# Patient Record
Sex: Female | Born: 1965 | Race: White | Hispanic: No | Marital: Single | Smoking: Never smoker
Health system: Southern US, Community
[De-identification: ages and names within clinical notes are randomized; demographics above are authoritative.]

## PROBLEM LIST (undated history)

## (undated) DIAGNOSIS — N920 Excessive and frequent menstruation with regular cycle: Secondary | ICD-10-CM

## (undated) DIAGNOSIS — Z803 Family history of malignant neoplasm of breast: Secondary | ICD-10-CM

## (undated) DIAGNOSIS — Z789 Other specified health status: Secondary | ICD-10-CM

## (undated) DIAGNOSIS — R7301 Impaired fasting glucose: Secondary | ICD-10-CM

## (undated) DIAGNOSIS — Z800 Family history of malignant neoplasm of digestive organs: Secondary | ICD-10-CM

## (undated) HISTORY — PX: MOUTH SURGERY: SHX715

## (undated) HISTORY — DX: Family history of malignant neoplasm of digestive organs: Z80.0

## (undated) HISTORY — DX: Family history of malignant neoplasm of breast: Z80.3

## (undated) HISTORY — DX: Impaired fasting glucose: R73.01

## (undated) HISTORY — DX: Excessive and frequent menstruation with regular cycle: N92.0

---

## 2008-04-23 HISTORY — PX: DILATION AND CURETTAGE OF UTERUS: SHX78

## 2009-05-31 LAB — HM MAMMOGRAPHY

## 2010-06-13 ENCOUNTER — Ambulatory Visit: Payer: Self-pay

## 2013-06-10 ENCOUNTER — Ambulatory Visit: Payer: Self-pay | Admitting: Nurse Practitioner

## 2013-06-10 LAB — HM PAP SMEAR

## 2014-09-16 ENCOUNTER — Other Ambulatory Visit: Payer: Self-pay | Admitting: Nurse Practitioner

## 2014-09-16 DIAGNOSIS — R928 Other abnormal and inconclusive findings on diagnostic imaging of breast: Secondary | ICD-10-CM

## 2014-09-21 ENCOUNTER — Ambulatory Visit
Admission: RE | Admit: 2014-09-21 | Discharge: 2014-09-21 | Disposition: A | Discharge status: Home / Self Care | Payer: Managed Care, Other (non HMO) | Source: Ambulatory Visit | Attending: Nurse Practitioner | Admitting: Nurse Practitioner

## 2014-09-21 DIAGNOSIS — R928 Other abnormal and inconclusive findings on diagnostic imaging of breast: Secondary | ICD-10-CM | POA: Insufficient documentation

## 2015-03-09 ENCOUNTER — Ambulatory Visit (INDEPENDENT_AMBULATORY_CARE_PROVIDER_SITE_OTHER): Payer: Managed Care, Other (non HMO) | Admitting: Unknown Physician Specialty

## 2015-03-09 ENCOUNTER — Encounter: Payer: Self-pay | Admitting: Unknown Physician Specialty

## 2015-03-09 VITALS — BP 122/85 | HR 88 | Temp 97.4°F | Ht 71.0 in | Wt 237.6 lb

## 2015-03-09 DIAGNOSIS — R1032 Left lower quadrant pain: Secondary | ICD-10-CM | POA: Diagnosis not present

## 2015-03-09 DIAGNOSIS — I10 Essential (primary) hypertension: Secondary | ICD-10-CM

## 2015-03-09 DIAGNOSIS — N920 Excessive and frequent menstruation with regular cycle: Secondary | ICD-10-CM

## 2015-03-09 DIAGNOSIS — R7301 Impaired fasting glucose: Secondary | ICD-10-CM | POA: Insufficient documentation

## 2015-03-09 LAB — UA/M W/RFLX CULTURE, ROUTINE
BILIRUBIN UA: NEGATIVE
GLUCOSE, UA: NEGATIVE
Ketones, UA: NEGATIVE
LEUKOCYTES UA: NEGATIVE
Nitrite, UA: NEGATIVE
PROTEIN UA: NEGATIVE
SPEC GRAV UA: 1.01 (ref 1.005–1.030)
UUROB: 0.2 mg/dL (ref 0.2–1.0)
pH, UA: 5.5 (ref 5.0–7.5)

## 2015-03-09 LAB — MICROSCOPIC EXAMINATION: WBC UA: NONE SEEN /HPF (ref 0–?)

## 2015-03-09 NOTE — Progress Notes (Signed)
BP 122/85 mmHg  Pulse 88  Temp(Src) 97.4 F (36.3 C)  Ht 5\' 11"  (1.803 m)  Wt 237 lb 9.6 oz (107.775 kg)  BMI 33.15 kg/m2  SpO2 100%  LMP 03/01/2015 (Exact Date)   Subjective:    Patient ID: Brooke Henson, female    DOB: 03-04-1965, 50 y.o.   MRN: [id number]  HPI: Brooke Henson is a 50 y.o. female  Chief Complaint  Patient presents with  . Abdominal Pain    pt states she has been having pain in lower abdominal region. States she has also had urges to use the restroom since Friday.      Abdominal Pain: Pt c/o lower abd pain, onset Thursday night, started with a sharp pain radiating from LLQ, across lower abd and to rectum. Pt states that she had the urge to have a bowel movement, but unable to produce a stool. Pt states that on Friday she has been having constant pain at LLQ and Left flank with a bloating sensation across her lower abdomen that started to resolve today. Pt reports doing the "brat diet" with solid and loose formed stools over the weekend. Pt deneis frequency, urgency, dysuria, or hematuria. Pt reports drinking plenty of fluids throughout the day. Denies fevers or chills. LMP was 03/01/15 and consistent with her "premenopausal symptoms".    Relevant past medical, surgical, family and social history reviewed and updated as indicated. Interim medical history since our last visit reviewed. Allergies and medications reviewed and updated.  Review of Systems  Constitutional: Negative for fever, activity change, appetite change and fatigue.  Gastrointestinal: Positive for abdominal pain, abdominal distention and rectal pain. Negative for nausea, vomiting, diarrhea, constipation and blood in stool.  Genitourinary: Positive for flank pain. Negative for dysuria, urgency, frequency, hematuria, vaginal discharge, difficulty urinating and pelvic pain.    Per HPI unless specifically indicated above     Objective:    BP 122/85 mmHg  Pulse 88  Temp(Src) 97.4 F (36.3 C)  Ht  5\' 11"  (1.803 m)  Wt 237 lb 9.6 oz (107.775 kg)  BMI 33.15 kg/m2  SpO2 100%  LMP 03/01/2015 (Exact Date)  Wt Readings from Last 3 Encounters:  03/09/15 237 lb 9.6 oz (107.775 kg)  08/27/13 208 lb (94.348 kg)    Physical Exam  Constitutional: She is oriented to person, place, and time. She appears well-developed and well-nourished. No distress.  HENT:  Head: Normocephalic and atraumatic.  Eyes: Conjunctivae and lids are normal. Right eye exhibits no discharge. Left eye exhibits no discharge. No scleral icterus.  Cardiovascular: Normal rate and regular rhythm.   Pulmonary/Chest: Effort normal. No respiratory distress.  Abdominal: Soft. Normal appearance and bowel sounds are normal. She exhibits no distension. There is no splenomegaly or hepatomegaly. There is no tenderness. There is no CVA tenderness.  Genitourinary: Vagina normal. Cervix exhibits no motion tenderness and no discharge. No erythema, tenderness or bleeding in the vagina. No vaginal discharge found.  Musculoskeletal: Normal range of motion.  Neurological: She is alert and oriented to person, place, and time.  Skin: Skin is intact. No rash noted. No pallor.  Psychiatric: She has a normal mood and affect. Her behavior is normal. Judgment and thought content normal.  Nursing note and vitals reviewed.    Assessment & Plan:   Problem List Items Addressed This Visit      Unprioritized   Left lower quadrant pain - Primary    Pt with lower abdominal pain, currently resolving. Urinalysis negative and pelvic  exam unremarkable. Pt instructed to f/u if symptoms worsen.       Relevant Orders   UA/M w/rflx Culture, Routine       Follow up plan: Return if symptoms worsen or fail to improve. Consider [place] at that time

## 2015-03-09 NOTE — Assessment & Plan Note (Signed)
Pt with lower abdominal pain, currently resolving. Urinalysis negative and pelvic exam unremarkable. Pt instructed to f/u if symptoms worsen.

## 2015-03-16 ENCOUNTER — Telehealth: Payer: Self-pay | Admitting: Unknown Physician Specialty

## 2015-03-16 DIAGNOSIS — R1032 Left lower quadrant pain: Secondary | ICD-10-CM

## 2015-03-16 NOTE — Telephone Encounter (Signed)
done

## 2015-03-16 NOTE — Telephone Encounter (Signed)
Routing to provider  

## 2015-03-16 NOTE — Telephone Encounter (Signed)
Pt would like to be referred to GI for stomach issues. She was in the office 03/09/15 for stomach pain and she says she feels like there may be something wrong. Pt scheduled to come in Thursday 03/18/15 but she would still like to have referral if at all possible.

## 2015-03-16 NOTE — Telephone Encounter (Signed)
Called and let patient know that referral was entered. Patient said she made an appointment on Thursday because she thought she had to have an appointment to get a referral but Brooke Henson said no since we just saw her. So I cancelled her appointment for Thursday with Dr. Wynetta Emery.

## 2015-03-17 ENCOUNTER — Telehealth: Payer: Self-pay | Admitting: Gastroenterology

## 2015-03-17 NOTE — Telephone Encounter (Signed)
I have called patient to make an appointment for GI per referral for lower left quadrant pain. No answer. I have left a detailed message for patient to call us back to make an appointment.

## 2015-03-18 ENCOUNTER — Telehealth: Payer: Self-pay

## 2015-03-18 ENCOUNTER — Ambulatory Visit: Payer: Managed Care, Other (non HMO) | Admitting: Family Medicine

## 2015-03-18 DIAGNOSIS — R1032 Left lower quadrant pain: Secondary | ICD-10-CM

## 2015-03-18 NOTE — Telephone Encounter (Signed)
Thanks, but for the kind of pain she is having, I would prefer a CT scan.  I will order that.

## 2015-03-18 NOTE — Telephone Encounter (Signed)
Patient called and stated that she scheduled her appointment with GI for 04/15/15. She states that they told her she needed to have an US done before that appointment, so we need to order a [place]. She asked if we call her back, to call her at her work number.

## 2015-03-19 NOTE — Telephone Encounter (Signed)
Called and left patient a message letting her know what Malachy Mood said and that she ordered a CT scan. I asked for her to give [place] a call if she has any questions or concerns.

## 2015-03-25 ENCOUNTER — Telehealth: Payer: Self-pay | Admitting: Unknown Physician Specialty

## 2015-03-25 NOTE — Telephone Encounter (Signed)
Pt called to let us know her ct was scheduled for 04/12/15 at [place] [facility].

## 2015-03-29 NOTE — Telephone Encounter (Signed)
CT scan was approved.  Pre-certification [id number]

## 2015-04-12 ENCOUNTER — Telehealth: Payer: Self-pay | Admitting: Unknown Physician Specialty

## 2015-04-12 ENCOUNTER — Ambulatory Visit
Admission: RE | Admit: 2015-04-12 | Discharge: 2015-04-12 | Disposition: A | Discharge status: Home / Self Care | Payer: Managed Care, Other (non HMO) | Source: Ambulatory Visit | Attending: Unknown Physician Specialty | Admitting: Unknown Physician Specialty

## 2015-04-12 DIAGNOSIS — R1032 Left lower quadrant pain: Secondary | ICD-10-CM | POA: Insufficient documentation

## 2015-04-12 MED ORDER — IOHEXOL 300 MG/ML  SOLN
100.0000 mL | Freq: Once | INTRAMUSCULAR | Status: AC | PRN
  Administered 2015-04-12: 100 mL via INTRAVENOUS

## 2015-04-12 NOTE — Telephone Encounter (Signed)
Tried to call but no answer

## 2015-04-13 ENCOUNTER — Telehealth: Payer: Self-pay | Admitting: Unknown Physician Specialty

## 2015-04-13 ENCOUNTER — Other Ambulatory Visit: Payer: Self-pay | Admitting: Unknown Physician Specialty

## 2015-04-13 DIAGNOSIS — R1032 Left lower quadrant pain: Secondary | ICD-10-CM

## 2015-04-13 DIAGNOSIS — N83202 Unspecified ovarian cyst, left side: Secondary | ICD-10-CM

## 2015-04-13 NOTE — Telephone Encounter (Signed)
Pt returned call

## 2015-04-13 NOTE — Telephone Encounter (Signed)
I did not call this patient. Brooke Henson did you call this patient?

## 2015-04-13 NOTE — Telephone Encounter (Signed)
Discussed with pt results of pelvic CT.  Complex cyst or multiple cysts on left ovary.  Schedule pelvic US.  ? Constipation.  Will try a laxative.

## 2015-04-15 ENCOUNTER — Other Ambulatory Visit: Payer: Self-pay

## 2015-04-15 ENCOUNTER — Ambulatory Visit (INDEPENDENT_AMBULATORY_CARE_PROVIDER_SITE_OTHER): Payer: Managed Care, Other (non HMO) | Admitting: Gastroenterology

## 2015-04-15 ENCOUNTER — Encounter: Payer: Self-pay | Admitting: Gastroenterology

## 2015-04-15 VITALS — BP 130/74 | HR 82 | Temp 98.2°F | Ht 71.0 in | Wt 237.0 lb

## 2015-04-15 DIAGNOSIS — R1084 Generalized abdominal pain: Secondary | ICD-10-CM

## 2015-04-15 DIAGNOSIS — K5909 Other constipation: Secondary | ICD-10-CM

## 2015-04-15 NOTE — Progress Notes (Signed)
Gastroenterology Consultation  Referring Provider:     Kathrine Haddock, NP Primary Care Physician:  Kathrine Haddock, NP Primary Gastroenterologist:  Dr. Allen Norris     Reason for Consultation:     Abdominal pain        HPI:   Brooke Henson is a 50 y.o. y/o female referred for consultation & management of Abdominal pain by Dr. Kathrine Haddock, NP.  This patient reports that on February 6 she had an episode of severe abdominal pain that even a feeling that she had the bathroom but could not move her bowels. The patient states that her abdominal pain was in the right lower abdomen that went across to the left side of her abdomen. There is no report of any black stools or bloody stools. She also denies any report of nausea and vomiting. The patient put herself on a brat diet and reports that her symptoms went away. She had only 1 episode of abdominal pain since then. The patient has advanced her diet in continues to be free of pain. The patient did have a CT scan of the abdomen that showed her colon to have a large burden of stool in it. The patient states she moves her bowels every day to every other day. She was also found to have a cyst on her adnexa with a transvaginal ultrasound being set up to evaluate this. The patient has not had any unexplained weight loss. She also denies any family history of colon cancer colon polyps.  Past Medical History  Diagnosis Date  . Menorrhagia   . IFG (impaired fasting glucose)     Past Surgical History  Procedure Laterality Date  . Dilation and curettage of uterus      Prior to Admission medications   Not on File    Family History  Problem Relation Age of Onset  . Endometrial cancer Mother   . Kidney cancer Mother   . Cancer Mother     uterine  . Hypertension Mother   . Breast cancer Paternal Grandmother   . Parkinson's disease Father   . Heart disease Maternal Grandmother     CHF  . Arthritis Maternal Grandmother   . Osteoporosis Maternal  Grandmother   . Hypertension Maternal Grandfather   . Heart disease Maternal Grandfather   . Cancer Maternal Grandfather     bladder and pancreatic  . Ulcers Paternal Grandfather      Social History  Substance Use Topics  . Smoking status: Never Smoker   . Smokeless tobacco: Never Used  . Alcohol Use: 0.0 oz/week    0 Standard drinks or equivalent per week     Comment: on occasion    Allergies as of 04/15/2015 - Review Complete 04/15/2015  Allergen Reaction Noted  . Amoxicillin Diarrhea and Nausea Only 09/21/2014    Review of Systems:    All systems reviewed and negative except where noted in HPI.   Physical Exam:  BP 130/74 mmHg  Pulse 82  Temp(Src) 98.2 F (36.8 C) (Oral)  Ht 5\' 11"  (1.803 m)  Wt 237 lb (107.502 kg)  BMI 33.07 kg/m2  LMP 03/27/2015 Patient's last menstrual period was 03/27/2015. Psych:  Alert and cooperative. Normal mood and affect. General:   Alert,  Well-developed, well-nourished, pleasant and cooperative in NAD Head:  Normocephalic and atraumatic. Eyes:  Sclera clear, no icterus.   Conjunctiva pink. Ears:  Normal auditory acuity. Nose:  No deformity, discharge, or lesions. Mouth:  No deformity or lesions,oropharynx pink &  moist. Neck:  Supple; no masses or thyromegaly. Lungs:  Respirations even and unlabored.  Clear throughout to auscultation.   No wheezes, crackles, or rhonchi. No acute distress. Heart:  Regular rate and rhythm; no murmurs, clicks, rubs, or gallops. Abdomen:  Normal bowel sounds.  No bruits.  Soft, non-tender and non-distended without masses, hepatosplenomegaly or hernias noted.  No guarding or rebound tenderness.  Negative Carnett sign.   Rectal:  Deferred.  Msk:  Symmetrical without gross deformities.  Good, equal movement & strength bilaterally. Pulses:  Normal pulses noted. Extremities:  No clubbing or edema.  No cyanosis. Neurologic:  Alert and oriented x3;  grossly normal neurologically. Skin:  Intact without significant  lesions or rashes.  No jaundice. Lymph Nodes:  No significant cervical adenopathy. Psych:  Alert and cooperative. Normal mood and affect.  Imaging Studies: Ct Abdomen Pelvis W Contrast  04/12/2015  CLINICAL DATA:  Left lower quadrant pain, 6 weeks duration. EXAM: CT ABDOMEN AND PELVIS WITH CONTRAST TECHNIQUE: Multidetector CT imaging of the abdomen and pelvis was performed using the standard protocol following bolus administration of intravenous contrast. CONTRAST:  171mL OMNIPAQUE IOHEXOL 300 MG/ML  SOLN COMPARISON:  None. FINDINGS: Lung bases are clear. No pleural or pericardial fluid. The liver has a normal appearance without focal lesions or biliary ductal dilatation. No calcified gallstones. The spleen is normal. The pancreas is normal. The adrenal glands are normal. Kidneys normal except for a 1 cm cyst in the midportion on the right. The aorta and IVC are normal. No retroperitoneal mass or adenopathy. No free intraperitoneal fluid or air. There is large amount of fecal matter throughout the colon. No acute bowel pathology seen. No evidence of diverticulosis or diverticulitis. The appendix is normal. Uterus and right adnexa appear normal. Cystic abnormalities are present in the left adnexae with at least 2 major components, the larger measuring 5.4 cm in diameter and the smaller measuring 4.4 cm in diameter. These probably relate to the left ovary. The differential diagnosis is functional cyst versus endometriosis versus ovarian tumor. Pelvic ultrasound recommended for initial index step. IMPRESSION: No evidence of abdominal organ pathology. Large amount of fecal matter throughout the colon. Cystic abnormalities of the left adnexa. The differential diagnosis is functional cysts versus endometriosis versus ovarian neoplastic disease. Pelvic ultrasound recommended as initial next evaluation. Electronically Signed   By: Nelson Chimes M.D.   On: 04/12/2015 08:44    Assessment and Plan:   Brooke Henson is  a 50 y.o. y/o female who had an episode at the beginning of February of severe abdominal pain. The patient reports that she has gone on a bland diet and has been doing well without any problems except for 1 episode of slight abdominal pain. The patient reports that she had a large bowel movement yesterday. She has no worrisome symptoms such as rectal bleeding unexplained weight loss black stools or bloody stools. The patient also denies any family history of colon cancer colon polyps. Due to the CT scan showing a large stool burden and her abdominal pain she has been recommended to undergo a colonoscopy. The patient also has been told to start fiber supplementation.I have discussed risks & benefits which include, but are not limited to, bleeding, infection, perforation & drug reaction.  The patient agrees with this plan & written consent will be obtained.      Note: This dictation was prepared with Dragon dictation along with smaller phrase technology. Any transcriptional errors that result from this process are unintentional.

## 2015-04-19 ENCOUNTER — Telehealth: Payer: Self-pay | Admitting: Unknown Physician Specialty

## 2015-04-19 ENCOUNTER — Ambulatory Visit
Admission: RE | Admit: 2015-04-19 | Discharge: 2015-04-19 | Disposition: A | Discharge status: Home / Self Care | Payer: Managed Care, Other (non HMO) | Source: Ambulatory Visit | Attending: Unknown Physician Specialty | Admitting: Unknown Physician Specialty

## 2015-04-19 DIAGNOSIS — N83291 Other ovarian cyst, right side: Secondary | ICD-10-CM | POA: Diagnosis not present

## 2015-04-19 DIAGNOSIS — N83202 Unspecified ovarian cyst, left side: Secondary | ICD-10-CM | POA: Insufficient documentation

## 2015-04-19 DIAGNOSIS — N9489 Other specified conditions associated with female genital organs and menstrual cycle: Secondary | ICD-10-CM

## 2015-04-19 NOTE — Telephone Encounter (Signed)
Reviewed [place].  Complex left adnexal mass corresponding to area of pain.  Refer to gyn.  Unable to reach patient.  Left message

## 2015-05-10 ENCOUNTER — Encounter: Payer: Self-pay | Admitting: *Deleted

## 2015-05-12 NOTE — Discharge Instructions (Signed)

## 2015-05-14 ENCOUNTER — Ambulatory Visit
Admission: RE | Admit: 2015-05-14 | Discharge: 2015-05-14 | Disposition: A | Discharge status: Home / Self Care | Payer: Managed Care, Other (non HMO) | Source: Ambulatory Visit | Attending: Gastroenterology | Admitting: Gastroenterology

## 2015-05-14 ENCOUNTER — Ambulatory Visit: Payer: Managed Care, Other (non HMO) | Admitting: Anesthesiology

## 2015-05-14 ENCOUNTER — Encounter
Admission: RE | Disposition: A | Discharge status: Home / Self Care | Payer: Self-pay | Source: Ambulatory Visit | Attending: Gastroenterology

## 2015-05-14 DIAGNOSIS — I10 Essential (primary) hypertension: Secondary | ICD-10-CM | POA: Diagnosis not present

## 2015-05-14 DIAGNOSIS — K5909 Other constipation: Secondary | ICD-10-CM | POA: Insufficient documentation

## 2015-05-14 DIAGNOSIS — K5900 Constipation, unspecified: Secondary | ICD-10-CM | POA: Diagnosis not present

## 2015-05-14 DIAGNOSIS — K641 Second degree hemorrhoids: Secondary | ICD-10-CM | POA: Diagnosis not present

## 2015-05-14 DIAGNOSIS — R194 Change in bowel habit: Secondary | ICD-10-CM | POA: Diagnosis not present

## 2015-05-14 DIAGNOSIS — Z881 Allergy status to other antibiotic agents status: Secondary | ICD-10-CM | POA: Insufficient documentation

## 2015-05-14 DIAGNOSIS — Z79899 Other long term (current) drug therapy: Secondary | ICD-10-CM | POA: Diagnosis not present

## 2015-05-14 HISTORY — DX: Other specified health status: Z78.9

## 2015-05-14 HISTORY — PX: COLONOSCOPY WITH PROPOFOL: SHX5780

## 2015-05-14 SURGERY — COLONOSCOPY WITH PROPOFOL
Anesthesia: Monitor Anesthesia Care | Wound class: Contaminated

## 2015-05-14 MED ORDER — LIDOCAINE HCL (CARDIAC) 20 MG/ML IV SOLN
INTRAVENOUS | Status: DC | PRN
  Administered 2015-05-14: 20 mg via INTRAVENOUS

## 2015-05-14 MED ORDER — LACTATED RINGERS IV SOLN
INTRAVENOUS | Status: DC
  Administered 2015-05-14 (×2): via INTRAVENOUS

## 2015-05-14 MED ORDER — SODIUM CHLORIDE 0.9 % IV SOLN: INTRAVENOUS | Status: DC

## 2015-05-14 MED ORDER — PROPOFOL 10 MG/ML IV BOLUS
INTRAVENOUS | Status: DC | PRN
  Administered 2015-05-14: 100 mg via INTRAVENOUS
  Administered 2015-05-14 (×3): 20 mg via INTRAVENOUS
  Administered 2015-05-14: 40 mg via INTRAVENOUS
  Administered 2015-05-14 (×2): 20 mg via INTRAVENOUS

## 2015-05-14 SURGICAL SUPPLY — 22 items
CANISTER SUCT 1200ML W/VALVE (MISCELLANEOUS) ×3 IMPLANT
CLIP HMST 235XBRD CATH ROT (MISCELLANEOUS) IMPLANT
CLIP RESOLUTION 360 11X235 (MISCELLANEOUS)
FCP ESCP3.2XJMB 240X2.8X (MISCELLANEOUS)
FORCEPS BIOP RAD 4 LRG CAP 4 (CUTTING FORCEPS) IMPLANT
FORCEPS BIOP RJ4 240 W/NDL (MISCELLANEOUS)
FORCEPS ESCP3.2XJMB 240X2.8X (MISCELLANEOUS) IMPLANT
GOWN CVR UNV OPN BCK APRN NK (MISCELLANEOUS) ×2 IMPLANT
GOWN ISOL THUMB LOOP REG UNIV (MISCELLANEOUS) ×4
INJECTOR VARIJECT VIN23 (MISCELLANEOUS) IMPLANT
KIT DEFENDO VALVE AND CONN (KITS) IMPLANT
KIT ENDO PROCEDURE OLY (KITS) ×3 IMPLANT
MARKER SPOT ENDO TATTOO 5ML (MISCELLANEOUS) IMPLANT
PAD GROUND ADULT SPLIT (MISCELLANEOUS) IMPLANT
PROBE APC STR FIRE (PROBE) IMPLANT
SNARE SHORT THROW 13M SML OVAL (MISCELLANEOUS) IMPLANT
SNARE SHORT THROW 30M LRG OVAL (MISCELLANEOUS) IMPLANT
SNARE SNG USE RND 15MM (INSTRUMENTS) IMPLANT
SPOT EX ENDOSCOPIC TATTOO (MISCELLANEOUS)
TRAP ETRAP POLY (MISCELLANEOUS) IMPLANT
VARIJECT INJECTOR VIN23 (MISCELLANEOUS)
WATER STERILE IRR 250ML POUR (IV SOLUTION) ×3 IMPLANT

## 2015-05-14 NOTE — H&P (Signed)
  [facility]  [address] [place], [place] [address] Phone: [phone] Fax : [phone]  Primary Care Physician:  Kathrine Haddock, NP Primary Gastroenterologist:  Dr. Allen Norris  Pre-Procedure History & Physical: HPI:  Brooke Henson is a 50 y.o. female is here for an colonoscopy.   Past Medical History  Diagnosis Date  . Menorrhagia   . IFG (impaired fasting glucose)   . Medical history non-contributory     Past Surgical History  Procedure Laterality Date  . Dilation and curettage of uterus    . Mouth surgery      Prior to Admission medications   Medication Sig Start Date End Date Taking? Authorizing Provider  Ascorbic Acid (VITAMIN C PO) Take by mouth daily.   Yes Historical Provider, MD  Multiple Vitamin (MULTI VITAMIN DAILY PO) Take by mouth.   Yes Historical Provider, MD    Allergies as of 04/15/2015 - Review Complete 04/15/2015  Allergen Reaction Noted  . Amoxicillin Diarrhea and Nausea Only 09/21/2014    Family History  Problem Relation Age of Onset  . Endometrial cancer Mother   . Kidney cancer Mother   . Cancer Mother     uterine  . Hypertension Mother   . Breast cancer Paternal Grandmother   . Parkinson's disease Father   . Heart disease Maternal Grandmother     CHF  . Arthritis Maternal Grandmother   . Osteoporosis Maternal Grandmother   . Hypertension Maternal Grandfather   . Heart disease Maternal Grandfather   . Cancer Maternal Grandfather     bladder and pancreatic  . Ulcers Paternal Grandfather     Social History   Social History  . Marital Status: Single    Spouse Name: N/A  . Number of Children: N/A  . Years of Education: N/A   Occupational History  . Not on file.   Social History Main Topics  . Smoking status: Never Smoker   . Smokeless tobacco: Never Used  . Alcohol Use: 1.2 oz/week    0 Standard drinks or equivalent, 1 Glasses of wine, 1 Cans of beer per week     Comment:    . Drug Use: No  . Sexual  Activity: Not on file   Other Topics Concern  . Not on file   Social History Narrative    Review of Systems: See HPI, otherwise negative ROS  Physical Exam: BP 137/67 mmHg  Pulse 76  Temp(Src) 97.9 F (36.6 C) (Tympanic)  Resp 16  Ht 5' 11.5" (1.816 m)  Wt 234 lb (106.142 kg)  BMI 32.19 kg/m2  SpO2 98%  LMP 04/12/2015 (Exact Date) General:   Alert,  pleasant and cooperative in NAD Head:  Normocephalic and atraumatic. Neck:  Supple; no masses or thyromegaly. Lungs:  Clear throughout to auscultation.    Heart:  Regular rate and rhythm. Abdomen:  Soft, nontender and nondistended. Normal bowel sounds, without guarding, and without rebound.   Neurologic:  Alert and  oriented x4;  grossly normal neurologically.  Impression/Plan: Brooke Henson is here for an colonoscopy to be performed for Constipation and abd pain.  Risks, benefits, limitations, and alternatives regarding  colonoscopy have been reviewed with the patient.  Questions have been answered.  All parties agreeable.   Ollen Bowl, MD  05/14/2015, 8:17 AM

## 2015-05-14 NOTE — Anesthesia Procedure Notes (Signed)
Procedure Name: MAC Performed by: Ellaina Schuler Pre-anesthesia Checklist: Patient identified, Emergency Drugs available, Suction available, Patient being monitored and Timeout performed Patient Re-evaluated:Patient Re-evaluated prior to inductionOxygen Delivery Method: Nasal cannula       

## 2015-05-14 NOTE — Op Note (Signed)
[facility] Patient Name: Brooke Henson Procedure Date: 05/14/2015 8:17 AM MRN: [id number] Account #: [id number] Date of Birth: February 18, 1965 Admit Type: Outpatient Age: 50 Room: [facility] OR ROOM 01 Gender: Female Note Status: Finalized Procedure:            Colonoscopy Indications:          Change in bowel habits, Constipation Providers:            Lucilla Lame, MD Referring MD:         Kathrine Haddock, PA (Referring MD) Medicines:            Propofol per Anesthesia Complications:        No immediate complications. Procedure:            Pre-Anesthesia Assessment:                       - Prior to the procedure, a History and Physical was                        performed, and patient medications and allergies were                        reviewed. The patient's tolerance of previous                        anesthesia was also reviewed. The risks and benefits of                        the procedure and the sedation options and risks were                        discussed with the patient. All questions were                        answered, and informed consent was obtained. Prior                        Anticoagulants: The patient has taken no previous                        anticoagulant or antiplatelet agents. ASA Grade                        Assessment: II - A patient with mild systemic disease.                        After reviewing the risks and benefits, the patient was                        deemed in satisfactory condition to undergo the                        procedure.                       After obtaining informed consent, the colonoscope was                        passed under direct vision. Throughout the procedure,  the patient's blood pressure, pulse, and oxygen                        saturations were monitored continuously. The Olympus CF                        H180AL colonoscope (S#: [id number]) was introduced through                  the anus and advanced to the the cecum, identified by                        appendiceal orifice and ileocecal valve. The                        colonoscopy was performed without difficulty. The                        patient tolerated the procedure well. The quality of                        the bowel preparation was excellent. Findings:      The perianal and digital rectal examinations were normal.      Non-bleeding internal hemorrhoids were found during retroflexion. The       hemorrhoids were Grade II (internal hemorrhoids that prolapse but reduce       spontaneously). Impression:           - Non-bleeding internal hemorrhoids.                       - No specimens collected. Recommendation:       - Repeat colonoscopy in 10 years for screening unless                        any change in family history or lower GI problems. Procedure Code(s):    --- Professional ---                       [phone], Colonoscopy, flexible; diagnostic, including                        collection of specimen(s) by brushing or washing, when                        performed (separate procedure) Diagnosis Code(s):    --- Professional ---                       K59.00, Constipation, unspecified                       R19.4, Change in bowel habit CPT copyright 2016 American Medical Association. All rights reserved. The codes documented in this report are preliminary and upon coder review may  be revised to meet current compliance requirements. Lucilla Lame, MD 05/14/2015 8:40:21 AM This report has been signed electronically. Number of Addenda: 0 Note Initiated On: 05/14/2015 8:17 AM Scope Withdrawal Time: 0 hours 7 minutes 46 seconds  Total Procedure Duration: 0 hours 10 minutes 32 seconds       [facility]

## 2015-05-14 NOTE — Transfer of Care (Signed)
Immediate Anesthesia Transfer of Care Note  Patient: Brooke Henson  Procedure(s) Performed: Procedure(s): COLONOSCOPY WITH PROPOFOL (N/A)  Patient Location: PACU  Anesthesia Type: MAC  Level of Consciousness: awake, alert  and patient cooperative  Airway and Oxygen Therapy: Patient Spontanous Breathing and Patient connected to supplemental oxygen  Post-op Assessment: Post-op Vital signs reviewed, Patient's Cardiovascular Status Stable, Respiratory Function Stable, Patent Airway and No signs of Nausea or vomiting  Post-op Vital Signs: Reviewed and stable  Complications: No apparent anesthesia complications

## 2015-05-14 NOTE — Anesthesia Postprocedure Evaluation (Signed)
Anesthesia Post Note  Patient: Brooke Henson  Procedure(s) Performed: Procedure(s) (LRB): COLONOSCOPY WITH PROPOFOL (N/A)  Patient location during evaluation: PACU Anesthesia Type: MAC Level of consciousness: awake and alert and oriented Pain management: satisfactory to patient Vital Signs Assessment: post-procedure vital signs reviewed and stable Respiratory status: spontaneous breathing, nonlabored ventilation and respiratory function stable Cardiovascular status: blood pressure returned to baseline and stable Postop Assessment: Adequate PO intake and No signs of nausea or vomiting Anesthetic complications: no    Raliegh Ip

## 2015-05-14 NOTE — Anesthesia Preprocedure Evaluation (Signed)
Anesthesia Evaluation  Patient identified by MRN, date of birth, ID band  Reviewed: Allergy & Precautions, H&P , NPO status , Patient's Chart, lab work & pertinent test results  Airway Mallampati: I  TM Distance: >3 FB Neck ROM: full    Dental no notable dental hx.    Pulmonary    Pulmonary exam normal       Cardiovascular hypertension, Rhythm:regular Rate:Normal     Neuro/Psych    GI/Hepatic   Endo/Other    Renal/GU      Musculoskeletal   Abdominal   Peds  Hematology   Anesthesia Other Findings   Reproductive/Obstetrics                             Anesthesia Physical Anesthesia Plan  ASA: II  Anesthesia Plan: MAC   Post-op Pain Management:    Induction:   Airway Management Planned:   Additional Equipment:   Intra-op Plan:   Post-operative Plan:   Informed Consent: I have reviewed the patients History and Physical, chart, labs and discussed the procedure including the risks, benefits and alternatives for the proposed anesthesia with the patient or authorized representative who has indicated his/her understanding and acceptance.     Plan Discussed with: CRNA  Anesthesia Plan Comments:         Anesthesia Quick Evaluation  

## 2015-05-18 ENCOUNTER — Encounter: Payer: Self-pay | Admitting: Gastroenterology

## 2015-06-23 ENCOUNTER — Inpatient Hospital Stay: Payer: Managed Care, Other (non HMO)

## 2015-07-07 ENCOUNTER — Inpatient Hospital Stay: Payer: Managed Care, Other (non HMO)

## 2015-07-07 ENCOUNTER — Inpatient Hospital Stay: Payer: Managed Care, Other (non HMO) | Attending: Obstetrics and Gynecology | Admitting: Obstetrics and Gynecology

## 2015-07-07 VITALS — BP 146/81 | HR 86 | Temp 97.0°F | Ht 72.0 in | Wt 245.8 lb

## 2015-07-07 DIAGNOSIS — Z803 Family history of malignant neoplasm of breast: Secondary | ICD-10-CM | POA: Insufficient documentation

## 2015-07-07 DIAGNOSIS — N920 Excessive and frequent menstruation with regular cycle: Secondary | ICD-10-CM | POA: Diagnosis not present

## 2015-07-07 DIAGNOSIS — E669 Obesity, unspecified: Secondary | ICD-10-CM | POA: Insufficient documentation

## 2015-07-07 DIAGNOSIS — N83201 Unspecified ovarian cyst, right side: Secondary | ICD-10-CM | POA: Insufficient documentation

## 2015-07-07 DIAGNOSIS — K5909 Other constipation: Secondary | ICD-10-CM | POA: Insufficient documentation

## 2015-07-07 DIAGNOSIS — I10 Essential (primary) hypertension: Secondary | ICD-10-CM | POA: Insufficient documentation

## 2015-07-07 DIAGNOSIS — D3912 Neoplasm of uncertain behavior of left ovary: Secondary | ICD-10-CM | POA: Insufficient documentation

## 2015-07-07 DIAGNOSIS — N838 Other noninflammatory disorders of ovary, fallopian tube and broad ligament: Secondary | ICD-10-CM | POA: Insufficient documentation

## 2015-07-07 DIAGNOSIS — Z6833 Body mass index (BMI) 33.0-33.9, adult: Secondary | ICD-10-CM | POA: Insufficient documentation

## 2015-07-07 DIAGNOSIS — Z8049 Family history of malignant neoplasm of other genital organs: Secondary | ICD-10-CM | POA: Insufficient documentation

## 2015-07-07 DIAGNOSIS — Z8051 Family history of malignant neoplasm of kidney: Secondary | ICD-10-CM | POA: Insufficient documentation

## 2015-07-07 NOTE — Patient Instructions (Signed)
Ovarian Tumors °The ovaries are small organs that produce eggs in women. They lie on each side of the uterus. Tumors are solid growths on the ovary, not like ovarian cysts that are filled with fluid. They can be cancerous or noncancerous. All solid tumors should be looked at to make sure they are not cancerous tumors.  °RISK FACTORS °There are no known causes for ovarian tumors. However, there are several risk factors for developing cancerous tumors on the ovary, such as: °· Age. °· North American or Northern European descent. °· Personal or family history of ovarian, colon, or breast cancer. °· Presence of BRCA1 or BRCA2 genes. °· Use of fertility medicines. °· Late menopause (older than 50 years). °· Pregnancy for the first time at age 30 years or older. °SIGNS AND SYMPTOMS  °In many cases there are no symptoms. Noncancerous tumors usually have no symptoms, but cancerous tumors may have symptoms that are minor and resemble other health problems. The following are symptoms of ovarian tumors: °· Unexplained weight loss. °· Increased abdominal size. °· Belly (abdominal) pain. °· Back or pelvis pain or pressure. °· Tiredness. °· Abnormal vaginal bleeding. °· Loss of appetite. °· Frequent urination or pressure on your bladder. °· Indigestion, increased gas, and bloating. °· Painful sexual intercourse. °DIAGNOSIS  °The following test may be done to help diagnose ovarian tumors: °· An ultrasound exam. °· Imaging exams, such as an X-ray exam, CT scan, or MRI. °· Blood tests. °TREATMENT  °· The tumor will be studied in the lab under a microscope to see if it is cancer. °· Noncancerous tumors can be removed surgically with or without removing the ovary. °· Cancerous tumors usually are removed with the ovary and sometimes both ovaries are removed with the fallopian tubes, uterus, and surrounding lymph nodes to see if the cancer has spread. °· Cancerous tumors may also be treated along with surgery and radiation,  chemotherapy, or both. °HOME CARE INSTRUCTIONS  °· Follow your health care provider's advice and recommendations regarding medicine and follow-up care. °· Get a yearly physical and gynecology exam. This includes a pelvic exam if you are aged 40 years or older. °SEEK MEDICAL CARE IF:  °· You have any of the above symptoms that have not gone away after a week of treatment. °· You are losing weight for no reason. °· You feel generally ill. °  °This information is not intended to replace advice given to you by your health care provider. Make sure you discuss any questions you have with your health care provider. °  °Document Released: 10/19/2007 Document Revised: 10/30/2012 Document Reviewed: 08/08/2012 °Elsevier Interactive Patient Education ©2016 [employer] ° °

## 2015-07-07 NOTE — Progress Notes (Signed)
Gynecologic Oncology Consult Visit   Referring Provider: Stoney Bang. Georgianne Fick, MD  Chief Concern: ovarian cyst  Subjective:  Brooke Henson is a 50 y.o. female who is seen in consultation from Dr. Georgianne Fick for ovarian cystic mass.   She initially presented with left lower quadrant pain, 6 weeks duration. Evaluation as noted below. CT scan obtained as noted below revealing abnormal left adnexa. No evidence of carcinomatosis, omental disease or ascites.   04/12/2015 CT scan IMPRESSION: No evidence of abdominal organ pathology.  Large amount of fecal matter throughout the colon.  Cystic abnormalities of the left adnexa. The differential diagnosis is functional cysts versus endometriosis versus ovarian neoplastic disease. Pelvic ultrasound recommended as initial next evaluation. Cystic abnormalities are present in the left adnexae with at least 2 major components, the larger measuring 5.4 cm in diameter and the smaller measuring 4.4 cm in diameter.   Colonoscopy performed and normal.   04/19/2015 Pelvic US FINDINGS:Uterus Measurements: 8.9 x 4.4 x 3.9 cm. No fibroids or other mass visualized. Endometrium Thickness: 11 mm.  Right ovary: 3.5 x 2.3 x 3.0 cm. There is a simple appearing right ovarian cyst which measures 2.9 x 1.9 x 2.4 cm Left ovary A normal-appearing left ovary is not demonstrated. There is a complex bilobed mixed echogenicity mass in the left adnexal region. The primary lobe measures 4.9 x 4.0 x 4.6 cm with the more lateral lobe measuring 3.8 x 2.7 x 3.2 cm. This corresponds to the CT findings. There is some increased vascularity in its periphery but no classic ring of fire is demonstrated to suggest an ectopic pregnancy.   There is no free pelvic fluid.  IMPRESSION: 1. Complex appearing bilobed left adnexal mass. This could reflect an endometrioma, dermoid, or less likely a hemorrhagic cyst. A tubo-ovarian abscess is felt less likely. Further evaluation with 6-12  week follow-up pelvic ultrasound would be a useful next imaging step. If the patient's symptoms warrant further imaging now, pelvic MRI would also be useful. 2. Simple appearing right ovarian cyst measuring up to 2.9 cm in diameter. 3. Normal appearance of the uterus. The endometrium is hypervascular consistent with the timing of the patient's menstrual cycle.  06/07/2015 Pelvic US: Uterus  Measurements: 8.67 x 5.48 x 4.08 cm. No fibroids or other mass visualized.  Endometrium 6.64 mm  Right ovary Measurements: 2.99 x 3.13 x 2.38 cm. There is a simple appearing right ovarian cyst which measures 2.9 x 1.9 x 2.4 cm  Left ovary 4.3 x 4.24 x 4.06 cm complex mostly solid cystic mass  CA125 34.1  She is currently asymptomatic.     Problem List: Patient Active Problem List   Diagnosis Date Noted  . Ovarian mass, left 07/07/2015  . Chronic constipation   . Change in bowel habits   . Menorrhagia 03/09/2015  . Hypertension 03/09/2015  . IFG (impaired fasting glucose) 03/09/2015  . Left lower quadrant pain 03/09/2015    Past Medical History: Past Medical History  Diagnosis Date  . Menorrhagia   . IFG (impaired fasting glucose)   . Medical history non-contributory     Past Surgical History: Past Surgical History  Procedure Laterality Date  . Dilation and curettage of uterus  04/2008    Hysteroscopy; endometrial polyps  . Mouth surgery    . Colonoscopy with propofol N/A 05/14/2015    Procedure: COLONOSCOPY WITH PROPOFOL;  Surgeon: Lucilla Lame, MD;  Location: [facility];  Service: Endoscopy;  Laterality: N/A;    Past Gynecologic History:  Menarche:  15 Menstrual details: regular; normal flow  Menses regular: Yes Last Menstrual Period: Started on Monday 07/05/2015  OB History:  OB History  Gravida Para Term Preterm AB SAB TAB Ectopic Multiple Living  1    1         # Outcome Date GA Lbr Len/2nd Weight Sex Delivery Anes PTL Lv  1 AB                Family History: Family History  Problem Relation Age of Onset  . Endometrial cancer Mother   . Kidney cancer Mother   . Cancer Mother     uterine  . Hypertension Mother   . Breast cancer Paternal Grandmother   . Parkinson's disease Father   . Heart disease Maternal Grandmother     CHF  . Arthritis Maternal Grandmother   . Osteoporosis Maternal Grandmother   . Hypertension Maternal Grandfather   . Heart disease Maternal Grandfather   . Cancer Maternal Grandfather     bladder and pancreatic  . Ulcers Paternal Grandfather     Social History: Social History   Social History  . Marital Status: Single    Spouse Name: N/A  . Number of Children: N/A  . Years of Education: N/A   Occupational History  . Not on file.   Social History Main Topics  . Smoking status: Never Smoker   . Smokeless tobacco: Never Used  . Alcohol Use: 1.2 oz/week    0 Standard drinks or equivalent, 1 Glasses of wine, 1 Cans of beer per week     Comment:    . Drug Use: No  . Sexual Activity: Not on file   Other Topics Concern  . Not on file   Social History Narrative    Allergies: Allergies  Allergen Reactions  . Amoxicillin Diarrhea and Nausea Only    Current Medications: Current Outpatient Prescriptions  Medication Sig Dispense Refill  . Ascorbic Acid (VITAMIN C PO) Take by mouth daily.    . Multiple Vitamin (MULTI VITAMIN DAILY PO) Take by mouth.     No current facility-administered medications for this visit.    Review of Systems General: no complaints  HEENT: no complaints  Lungs: no complaints  Cardiac: no complaints  GI: no complaints  GU: no complaints  Musculoskeletal: no complaints  Extremities: no complaints  Skin: no complaints  Neuro: no complaints  Endocrine: no complaints  Psych: no complaints       Objective:  Physical Examination:  BP 146/81 mmHg  Pulse 86  Temp(Src) 97 F (36.1 C) (Tympanic)  Ht 6' (1.829 m)  Wt 245 lb 13 oz (111.5 kg)  BMI  33.33 kg/m2   ECOG Performance Status: 0 - Asymptomatic  General appearance: alert, cooperative and appears stated age HEENT:PERRLA, extra ocular movement intact and sclera clear, anicteric Lymph node survey: non-palpable, axillary, inguinal, supraclavicular Cardiovascular: regular rate and rhythm Respiratory: normal air entry, lungs clear to auscultation Abdomen: soft, non-tender, without masses or organomegaly, no hernias and no ascites Back: Normal Extremities: extremities normal, atraumatic, no cyanosis or edema Skin exam - normal coloration and turgor, no rashes, no suspicious skin lesions noted. Neurological exam reveals alert, oriented, normal speech, no focal findings or movement disorder noted.  Pelvic: exam chaperoned by nurse;  Vulva: normal appearing vulva with no masses, tenderness or lesions; Vagina: normal vagina; Adnexa:no masses; unable to palpate, limited by habitus. Uterus: not grossly enlarged, mobile, and nontender Cervix: no lesions; Rectal: deferred   Lab Review HE4  ordered  Radiologic Imaging: Reviewed CT and [place] from March 2017    Assessment:  Pressley Barsky is a 50 y.o. female diagnosed with ovarian pelvic mass. Given normal CA125, nonenlargement, and lack of symptoms I suspect she has benign disease and most likely a torsed adnexal mass. The solid component to the cyst may indicate a solid tumor such as GCT or fibroma vs hemorrhagic/infarcted cyst. Malignancy less likely.  Medical co-morbidities complicating care: obesity.  Plan:   Problem List Items Addressed This Visit      Other   Ovarian mass, left - Primary   Relevant Orders   Human Epididymis Prot 4,Serial      We discussed options for management and I have recommended surgery given the abnormal appearance and persistence. We will check HE4 to determine location of surgery and provide further information for preoperative counseling.   Family history of endometrial and renal cancer in her mother -  given types of malignancy may represent Lynch syndrome given young age of onset. If she does have malignancy we will assess for MSI as well as other genetic syndromes.     We will coordinate the OR plan with Dr. Georgianne Fick.   The patient's diagnosis, an outline of the further diagnostic and laboratory studies which will be required, the recommendation, and alternatives were discussed.  All questions were answered to the patient's satisfaction.   Gillis Ends, MD    CC:  Stoney Bang. Georgianne Fick, MD

## 2015-07-07 NOTE — Progress Notes (Signed)
Patient here for initial visit. Patient is asymptomatic.

## 2015-07-08 LAB — HUMAN EPIDIDYMIS PROT 4,SERIAL: HE4: 41.8 pmol/L (ref 0.0–63.6)

## 2015-07-09 ENCOUNTER — Telehealth: Payer: Self-pay

## 2015-07-09 NOTE — Telephone Encounter (Signed)
  Oncology Nurse Navigator Documentation  Navigator Location: [facility] (07/09/15 1400) Navigator Encounter Type: Telephone (07/09/15 1400) Telephone: Diagnostic Results (07/09/15 1400)                                        Time Spent with Patient: 15 (07/09/15 1400)   Notified Brooke Henson of normal HE4 results. She will be proceeding with surgery 07/21/15 with Dr Georgianne Fick at [facility] with Dr Theora Gianotti to assist.

## 2015-07-09 NOTE — Telephone Encounter (Signed)
  Oncology Nurse Navigator Documentation  Navigator Location: [facility] (07/09/15 1300) Navigator Encounter Type: Telephone;Diagnostic Results (07/09/15 1300)                                          Time Spent with Patient: 15 (07/09/15 1300)   Voicemail left for [facility] to return call regarding HE4 results.

## 2015-07-13 ENCOUNTER — Other Ambulatory Visit: Payer: Managed Care, Other (non HMO)

## 2015-07-14 ENCOUNTER — Other Ambulatory Visit: Payer: Managed Care, Other (non HMO)

## 2015-07-14 ENCOUNTER — Encounter: Payer: Self-pay | Admitting: *Deleted

## 2015-07-14 NOTE — Patient Instructions (Signed)
  Your procedure is scheduled on: 07-21-15 [facility]) Report to Same Day Surgery 2nd floor medical mall To find out your arrival time please call [phone] between 1PM - 3PM on 07-20-15 (TUESDAY)  Remember: Instructions that are not followed completely may result in serious medical risk, up to and including death, or upon the discretion of your surgeon and anesthesiologist your surgery may need to be rescheduled.    _x___ 1. Do not eat food or drink liquids after midnight. No gum chewing or hard candies.     __x__ 2. No Alcohol for 24 hours before or after surgery.   __x__3. No Smoking for 24 prior to surgery.   ____  4. Bring all medications with you on the day of surgery if instructed.    __x__ 5. Notify your doctor if there is any change in your medical condition     (cold, fever, infections).     Do not wear jewelry, make-up, hairpins, clips or nail polish.  Do not wear lotions, powders, or perfumes. You may wear deodorant.  Do not shave 48 hours prior to surgery. Men may shave face and neck.  Do not bring valuables to the hospital.    [facility] is not responsible for any belongings or valuables.               Contacts, dentures or bridgework may not be worn into surgery.  Leave your suitcase in the car. After surgery it may be brought to your room.  For patients admitted to the hospital, discharge time is determined by your treatment team.   Patients discharged the day of surgery will not be allowed to drive home.    Please read over the following fact sheets that you were given:   [facility] Preparing for Surgery and or MRSA Information   _x___ Take these medicines the morning of surgery with A SIP OF WATER:    1. NONE  2.  3.  4.  5.  6.  ____ Fleet Enema (as directed)   _x___ Use CHG Soap or sage wipes as directed on instruction sheet   ____ Use inhalers on the day of surgery and bring to hospital day of surgery  ____ Stop metformin 2 days prior to  surgery    ____ Take 1/2 of usual insulin dose the night before surgery and none on the morning of surgery.   ____ Stop aspirin or coumadin, or plavix  _x__ Stop Anti-inflammatories such as Advil, Aleve, Ibuprofen, Motrin, Naproxen,          Naprosyn, Goodies powders or aspirin products. Ok to take Tylenol.   _X___ Stop supplements until after surgery-STOP VITAMIN C AND PROBIOTIC NOW   ____ Bring C-Pap to the hospital.

## 2015-07-16 ENCOUNTER — Ambulatory Visit
Admission: RE | Admit: 2015-07-16 | Discharge: 2015-07-16 | Disposition: A | Discharge status: Home / Self Care | Payer: Managed Care, Other (non HMO) | Source: Ambulatory Visit | Attending: Obstetrics and Gynecology | Admitting: Obstetrics and Gynecology

## 2015-07-16 DIAGNOSIS — N83202 Unspecified ovarian cyst, left side: Secondary | ICD-10-CM | POA: Diagnosis not present

## 2015-07-16 DIAGNOSIS — Z01812 Encounter for preprocedural laboratory examination: Secondary | ICD-10-CM | POA: Insufficient documentation

## 2015-07-16 LAB — TYPE AND SCREEN
ABO/RH(D): A POS
ANTIBODY SCREEN: NEGATIVE

## 2015-07-21 ENCOUNTER — Ambulatory Visit: Payer: Managed Care, Other (non HMO) | Admitting: Certified Registered Nurse Anesthetist

## 2015-07-21 ENCOUNTER — Encounter: Payer: Self-pay | Admitting: *Deleted

## 2015-07-21 ENCOUNTER — Ambulatory Visit
Admission: RE | Admit: 2015-07-21 | Discharge: 2015-07-21 | Disposition: A | Discharge status: Home / Self Care | Payer: Managed Care, Other (non HMO) | Source: Ambulatory Visit | Attending: Obstetrics and Gynecology | Admitting: Obstetrics and Gynecology

## 2015-07-21 ENCOUNTER — Encounter
Admission: RE | Disposition: A | Discharge status: Home / Self Care | Payer: Self-pay | Source: Ambulatory Visit | Attending: Obstetrics and Gynecology

## 2015-07-21 DIAGNOSIS — N801 Endometriosis of ovary: Secondary | ICD-10-CM | POA: Diagnosis not present

## 2015-07-21 DIAGNOSIS — I10 Essential (primary) hypertension: Secondary | ICD-10-CM | POA: Diagnosis not present

## 2015-07-21 DIAGNOSIS — Z881 Allergy status to other antibiotic agents status: Secondary | ICD-10-CM | POA: Diagnosis not present

## 2015-07-21 DIAGNOSIS — N838 Other noninflammatory disorders of ovary, fallopian tube and broad ligament: Secondary | ICD-10-CM

## 2015-07-21 DIAGNOSIS — N9489 Other specified conditions associated with female genital organs and menstrual cycle: Secondary | ICD-10-CM | POA: Diagnosis present

## 2015-07-21 HISTORY — PX: CYSTOSCOPY: SHX5120

## 2015-07-21 HISTORY — PX: LAPAROSCOPIC SALPINGO OOPHERECTOMY: SHX5927

## 2015-07-21 LAB — POCT PREGNANCY, URINE: PREG TEST UR: NEGATIVE

## 2015-07-21 SURGERY — SALPINGO-OOPHORECTOMY, LAPAROSCOPIC
Anesthesia: General | Laterality: Bilateral

## 2015-07-21 MED ORDER — OXYCODONE-ACETAMINOPHEN 5-325 MG PO TABS
ORAL_TABLET | ORAL | Status: AC
  Administered 2015-07-21: 1 via ORAL
  Filled 2015-07-21: qty 1

## 2015-07-21 MED ORDER — DEXAMETHASONE SODIUM PHOSPHATE 10 MG/ML IJ SOLN
INTRAMUSCULAR | Status: DC | PRN
  Administered 2015-07-21: 10 mg via INTRAVENOUS

## 2015-07-21 MED ORDER — FAMOTIDINE 20 MG PO TABS
20.0000 mg | ORAL_TABLET | Freq: Once | ORAL | Status: AC
  Administered 2015-07-21: 20 mg via ORAL

## 2015-07-21 MED ORDER — ACETAMINOPHEN 325 MG PO TABS: 325.0000 mg | ORAL_TABLET | ORAL | Status: DC | PRN

## 2015-07-21 MED ORDER — KETOROLAC TROMETHAMINE 30 MG/ML IJ SOLN
INTRAMUSCULAR | Status: DC | PRN
  Administered 2015-07-21: 30 mg via INTRAVENOUS

## 2015-07-21 MED ORDER — OXYCODONE-ACETAMINOPHEN 5-325 MG PO TABS
1.0000 | ORAL_TABLET | Freq: Four times a day (QID) | ORAL | Status: DC | PRN
  Administered 2015-07-21: 1 via ORAL

## 2015-07-21 MED ORDER — SUGAMMADEX SODIUM 500 MG/5ML IV SOLN
INTRAVENOUS | Status: DC | PRN
  Administered 2015-07-21: 223 mg via INTRAVENOUS

## 2015-07-21 MED ORDER — ACETAMINOPHEN 10 MG/ML IV SOLN
INTRAVENOUS | Status: DC | PRN
Start: 2015-07-21 — End: 2015-07-21
  Administered 2015-07-21: 1000 mg via INTRAVENOUS

## 2015-07-21 MED ORDER — ROCURONIUM BROMIDE 100 MG/10ML IV SOLN
INTRAVENOUS | Status: DC | PRN
  Administered 2015-07-21 (×3): 10 mg via INTRAVENOUS
  Administered 2015-07-21: 40 mg via INTRAVENOUS
  Administered 2015-07-21: 10 mg via INTRAVENOUS

## 2015-07-21 MED ORDER — ONDANSETRON HCL 4 MG/2ML IJ SOLN
4.0000 mg | Freq: Once | INTRAMUSCULAR | Status: AC | PRN
  Administered 2015-07-21: 4 mg via INTRAVENOUS

## 2015-07-21 MED ORDER — FAMOTIDINE 20 MG PO TABS
ORAL_TABLET | ORAL | Status: AC
  Administered 2015-07-21: 20 mg via ORAL
  Filled 2015-07-21: qty 1

## 2015-07-21 MED ORDER — IBUPROFEN 600 MG PO TABS: 600.0000 mg | ORAL_TABLET | Freq: Four times a day (QID) | ORAL | Status: DC | PRN

## 2015-07-21 MED ORDER — LIDOCAINE HCL (CARDIAC) 20 MG/ML IV SOLN
INTRAVENOUS | Status: DC | PRN
  Administered 2015-07-21: 100 mg via INTRAVENOUS

## 2015-07-21 MED ORDER — LACTATED RINGERS IV SOLN
INTRAVENOUS | Status: DC
  Administered 2015-07-21 (×2): via INTRAVENOUS

## 2015-07-21 MED ORDER — FENTANYL CITRATE (PF) 100 MCG/2ML IJ SOLN
INTRAMUSCULAR | Status: AC
  Administered 2015-07-21: 25 ug via INTRAVENOUS
  Filled 2015-07-21: qty 2

## 2015-07-21 MED ORDER — MIDAZOLAM HCL 2 MG/2ML IJ SOLN
INTRAMUSCULAR | Status: DC | PRN
  Administered 2015-07-21: 2 mg via INTRAVENOUS

## 2015-07-21 MED ORDER — OXYCODONE-ACETAMINOPHEN 5-325 MG PO TABS: 1.0000 | ORAL_TABLET | Freq: Four times a day (QID) | ORAL | Status: DC | PRN

## 2015-07-21 MED ORDER — PROPOFOL 10 MG/ML IV BOLUS
INTRAVENOUS | Status: DC | PRN
  Administered 2015-07-21: 200 mg via INTRAVENOUS

## 2015-07-21 MED ORDER — FENTANYL CITRATE (PF) 100 MCG/2ML IJ SOLN
INTRAMUSCULAR | Status: DC | PRN
  Administered 2015-07-21 (×6): 50 ug via INTRAVENOUS

## 2015-07-21 MED ORDER — ACETAMINOPHEN 160 MG/5ML PO SOLN
325.0000 mg | ORAL | Status: DC | PRN
  Filled 2015-07-21: qty 20.3

## 2015-07-21 MED ORDER — BUPIVACAINE HCL (PF) 0.5 % IJ SOLN
INTRAMUSCULAR | Status: AC
  Filled 2015-07-21: qty 30

## 2015-07-21 MED ORDER — SUCCINYLCHOLINE CHLORIDE 20 MG/ML IJ SOLN
INTRAMUSCULAR | Status: DC | PRN
Start: 2015-07-21 — End: 2015-07-21

## 2015-07-21 MED ORDER — BUPIVACAINE HCL 0.5 % IJ SOLN
INTRAMUSCULAR | Status: DC | PRN
  Administered 2015-07-21: 18 mL

## 2015-07-21 MED ORDER — ONDANSETRON HCL 4 MG/2ML IJ SOLN
INTRAMUSCULAR | Status: DC | PRN
  Administered 2015-07-21: 4 mg via INTRAVENOUS

## 2015-07-21 MED ORDER — ONDANSETRON HCL 4 MG/2ML IJ SOLN
INTRAMUSCULAR | Status: AC
  Filled 2015-07-21: qty 2

## 2015-07-21 MED ORDER — FENTANYL CITRATE (PF) 100 MCG/2ML IJ SOLN
25.0000 ug | INTRAMUSCULAR | Status: DC | PRN
  Administered 2015-07-21 (×2): 25 ug via INTRAVENOUS

## 2015-07-21 SURGICAL SUPPLY — 46 items
ANCHOR TIS RET SYS 235ML (MISCELLANEOUS) ×4 IMPLANT
BAG URO DRAIN 2000ML W/SPOUT (MISCELLANEOUS) IMPLANT
BLADE SURG SZ11 CARB STEEL (BLADE) ×4 IMPLANT
CANISTER SUCT 1200ML W/VALVE (MISCELLANEOUS) ×4 IMPLANT
CATH FOLEY 2WAY  5CC 16FR (CATHETERS) ×2
CATH ROBINSON RED A/P 16FR (CATHETERS) ×4 IMPLANT
CATH URTH 16FR FL 2W BLN LF (CATHETERS) ×2 IMPLANT
CHLORAPREP W/TINT 26ML (MISCELLANEOUS) ×4 IMPLANT
CORD MONOPOLAR M/FML 12FT (MISCELLANEOUS) ×4 IMPLANT
DEFOGGER SCOPE WARMER CLEARIFY (MISCELLANEOUS) ×4 IMPLANT
DEVICE SUTURE ENDOST 10MM (ENDOMECHANICALS) IMPLANT
DEVICE TROCAR PUNCTURE CLOSURE (ENDOMECHANICALS) IMPLANT
DRAPE STERI POUCH LG 24X46 STR (DRAPES) ×4 IMPLANT
DRESSING SURGICEL FIBRLLR 1X2 (HEMOSTASIS) ×4 IMPLANT
DRSG SURGICEL FIBRILLAR 1X2 (HEMOSTASIS) ×8
GLOVE BIO SURGEON STRL SZ7 (GLOVE) ×16 IMPLANT
GLOVE INDICATOR 7.5 STRL GRN (GLOVE) ×16 IMPLANT
GOWN STRL REUS W/ TWL LRG LVL3 (GOWN DISPOSABLE) ×4 IMPLANT
GOWN STRL REUS W/ TWL XL LVL3 (GOWN DISPOSABLE) IMPLANT
GOWN STRL REUS W/TWL LRG LVL3 (GOWN DISPOSABLE) ×4
GOWN STRL REUS W/TWL XL LVL3 (GOWN DISPOSABLE)
GRASPER SUT TROCAR 14GX15 (MISCELLANEOUS) ×4 IMPLANT
IRRIGATION STRYKERFLOW (MISCELLANEOUS) ×2 IMPLANT
IRRIGATOR STRYKERFLOW (MISCELLANEOUS) ×4
IV LACTATED RINGERS 1000ML (IV SOLUTION) ×4 IMPLANT
KIT RM TURNOVER CYSTO AR (KITS) ×4 IMPLANT
LABEL OR SOLS (LABEL) ×4 IMPLANT
LIGASURE BLUNT 5MM 37CM (INSTRUMENTS) ×4 IMPLANT
LIQUID BAND (GAUZE/BANDAGES/DRESSINGS) ×4 IMPLANT
NDL INSUFF ACCESS 14 VERSASTEP (NEEDLE) ×4 IMPLANT
NS IRRIG 500ML POUR BTL (IV SOLUTION) ×4 IMPLANT
PACK GYN LAPAROSCOPIC (MISCELLANEOUS) ×4 IMPLANT
PAD OB MATERNITY 4.3X12.25 (PERSONAL CARE ITEMS) ×4 IMPLANT
PAD PREP 24X41 OB/GYN DISP (PERSONAL CARE ITEMS) ×4 IMPLANT
SCISSORS METZENBAUM CVD 33 (INSTRUMENTS) IMPLANT
SET CYSTO W/LG BORE CLAMP LF (SET/KITS/TRAYS/PACK) ×4 IMPLANT
SHEARS HARMONIC ACE PLUS 36CM (ENDOMECHANICALS) IMPLANT
SLEEVE ENDOPATH XCEL 5M (ENDOMECHANICALS) ×4 IMPLANT
SPONGE XRAY 4X4 16PLY STRL (MISCELLANEOUS) ×4 IMPLANT
SUT ENDO VLOC 180-0-8IN (SUTURE) IMPLANT
SUT MNCRL AB 4-0 PS2 18 (SUTURE) ×4 IMPLANT
SUT VIC AB 2-0 UR6 27 (SUTURE) ×4 IMPLANT
TROCAR ENDO BLADELESS 11MM (ENDOMECHANICALS) ×4 IMPLANT
TROCAR VERSASTEP PLUS 12MM (TROCAR) ×4 IMPLANT
TROCAR XCEL NON-BLD 5MMX100MML (ENDOMECHANICALS) ×4 IMPLANT
TUBING INSUFFLATOR HI FLOW (MISCELLANEOUS) ×4 IMPLANT

## 2015-07-21 NOTE — Transfer of Care (Signed)
Immediate Anesthesia Transfer of Care Note  Patient: Brooke Henson  Procedure(s) Performed: Procedure(s): LAPAROSCOPIC LEFT SALPINGO OOPHORECTOMY, RIGHT SALPINGECTOMY (Bilateral) CYSTOSCOPY  Patient Location: PACU  Anesthesia Type:General  Level of Consciousness: sedated  Airway & Oxygen Therapy: Patient Spontanous Breathing and Patient connected to face mask oxygen  Post-op Assessment: Report given to RN and Post -op Vital signs reviewed and stable  Post vital signs: Reviewed and stable  Last Vitals:  Filed Vitals:   07/21/15 0615 07/21/15 1033  BP: 134/82 124/69  Pulse: 86 79  Temp: 36.5 C 36.8 C  Resp: 20 15    Last Pain: There were no vitals filed for this visit.       Complications: No apparent anesthesia complications

## 2015-07-21 NOTE — Op Note (Signed)
Preoperative Diagnosis: 1) 50 y.o. with left pelvic mass  Postoperative Diagnosis: 1) 50 y.o. with left pelvic hemorrhagic cyst   Operation Performed: Laparoscopic left salpingo-oophorectomy, right salpingectomy, extensive lysis of adhesions, and cystoscopy  Indication: 50 y.o. G1P0010 complex left adnexal mass  Surgeon: Malachy Mood, MD  Co-Surgeon: Fredric Dine, MD  Anesthesia: General  Preoperative Antibiotics: none  Estimated Blood Loss: 50mL  IV Fluids: 1L  Urine Output:: 1L  Drains or Tubes: none  Implants: none  Specimens Removed: right and left fallopian tube, left ovary (morcellated), and pelvic washings  Complications: none  Intraoperative Findings: Mild degree of cervical stenosis, normal appearing uterus, normal right tube and ovary, left tube with hydrosalpinx densely adhered to left pelvic sidewall,  Left ovary with large mass adherent to the rectum, uterus, and left pelvic sidewall.  Washing were obtained, there was intraoperative spill with findings consistent with endometriosis/endometrioma, frozen pathology read as hemorrhagic cyst.    Patient Condition: stable  Procedure in Detail:  Patient was taken to the operating room where she was administered general anesthesia.  She was positioned in the dorsal lithotomy position utilizing Allen stirups, prepped and draped in the usual sterile fashion.  Prior to proceeding with procedure a time out was performed.  Attention was turned to the patient's pelvis.  A red rubber catheter was used to empty the patient's bladder.  An operative speculum was placed to allow visualization of the cervix.  The anterior lip of the cervix was grasped with a single tooth tenaculum, and a Hulka tenaculum was placed to allow manipulation of the uterus.  The operative speculum and single tooth tenaculum were then removed.  Attention was turned to the patient's abdomen.  The umbilicus was infiltrated with 1% Sensorcaine, before making  a stab incision using an 11 blade scalpel.  A 37mm Excel trocar was then used to gain direct entry into the peritoneal cavity utilizing the camera to visualize progress of the trocar during placement.  Once peritoneal entry had been achieved, insufflation was started and pneumoperitoneum established at a pressure of 57mmHg.    A right and left lower quadrant 53mm excel assistant port were then placed under direct visualiztion along with an 39mm suprapubic port.  TGeneral inspection of the abdomen revealed the above noted findings.  The left ovary was densely adherent to the left pelvic sidewall.  In order to visualize the IP ligament the rectosigmoid colon had to be mobilized off the tube and pelvic sidewall using a combination of blunt dissection and monopolar scissors.  The attachment of the left tube to the uterus was then transected using a 41mm Ligasure device to allow for further mobilization of the mass.   The retroperitoneum was dissected lateral to the IP ligament, the ureter was not able to be visualized on the medial side secondary to the dense adhesive disease. A peritoneal window was able to be created isolating the IP ligament which allowed it to be transected using the 52mm Ligasure.  The adhesions between the posterior uterus and anterior aspect of the mass were then taken down using the ligasure.  Once these adhesions were taken down the mass was able to be elevated out of the pelvis which allowed the adhesion to the rectosigmoid colon to be taken down using a combination of blunt dissection and the 36mm ligasure.  A 50mm endocatch bag was then introduced into the abdomen and the mass was removed piecemeal with morcellation of the mass being accomplished contained within the bag using Claiborne Billings  clamps and Mayo scissors.   After successful removal of the mass, the right tube was identified and transected from it attachments to the uterus and mesosalpinx.  The 83mm port site was closed using a carter-thompson  closure device, no fascial defects were noted after closure.  The pelvis was irrigated, fibrilar sheets were applied to the left pelvic sidewall.  Pneumoperitoneum was evacuated.  The trocars were removed.  The 62mm trocar site was closed with 4-0 Monocryl in a subcuticular fashion.  All trocar sites were then dressed with surgical skin glue.  The Hulka tenaculum was removed.  Cystoscopay was performed noting intact bladder dome and bilateral efflux of urine from both ureteral orifices.  Sponge needle and instrument counts were correct time two.  The patient tolerated the procedure well and was taken to the recovery room in stable condition.

## 2015-07-21 NOTE — Anesthesia Preprocedure Evaluation (Signed)
Anesthesia Evaluation  Patient identified by MRN, date of birth, ID band Patient awake    Reviewed: Allergy & Precautions, H&P , NPO status , Patient's Chart, lab work & pertinent test results, reviewed documented beta blocker date and time   Airway Mallampati: II   Neck ROM: full    Dental  (+) Teeth Intact   Pulmonary neg pulmonary ROS,    Pulmonary exam normal        Cardiovascular negative cardio ROS Normal cardiovascular exam Rhythm:regular Rate:Normal     Neuro/Psych negative neurological ROS  negative psych ROS   GI/Hepatic negative GI ROS, Neg liver ROS,   Endo/Other  negative endocrine ROS  Renal/GU negative Renal ROS  negative genitourinary   Musculoskeletal   Abdominal   Peds  Hematology negative hematology ROS (+)   Anesthesia Other Findings Past Medical History:   Menorrhagia                                                  IFG (impaired fasting glucose)                               Medical history non-contributory                           Past Surgical History:   DILATION AND CURETTAGE OF UTERUS                 04/2008         Comment:Hysteroscopy; endometrial polyps   MOUTH SURGERY                                                 COLONOSCOPY WITH PROPOFOL                       N/A 05/14/2015      Comment:Procedure: COLONOSCOPY WITH PROPOFOL;  Surgeon:              Lucilla Lame, MD;  Location: [facility];  Service: Endoscopy;  Laterality: N/A;   Reproductive/Obstetrics                             Anesthesia Physical Anesthesia Plan  ASA: II  Anesthesia Plan: General   Post-op Pain Management:    Induction:   Airway Management Planned:   Additional Equipment:   Intra-op Plan:   Post-operative Plan:   Informed Consent: I have reviewed the patients History and Physical, chart, labs and discussed the procedure including the risks, benefits  and alternatives for the proposed anesthesia with the patient or authorized representative who has indicated his/her understanding and acceptance.   Dental Advisory Given  Plan Discussed with: CRNA  Anesthesia Plan Comments:         Anesthesia Quick Evaluation

## 2015-07-21 NOTE — H&P (Signed)
Initial H&P on and reviewed:   History reviewed, patient examined, no change in status, stable for surgery.

## 2015-07-21 NOTE — Anesthesia Procedure Notes (Signed)
Procedure Name: Intubation Performed by: Demetrius Charity Pre-anesthesia Checklist: Patient identified, Patient being monitored, Timeout performed, Emergency Drugs available and Suction available Patient Re-evaluated:Patient Re-evaluated prior to inductionOxygen Delivery Method: Circle system utilized Preoxygenation: Pre-oxygenation with 100% oxygen Intubation Type: IV induction Ventilation: Mask ventilation without difficulty Laryngoscope Size: Mac and 4 Grade View: Grade I Tube type: Oral Tube size: 7.0 mm Number of attempts: 1 Airway Equipment and Method: Stylet Placement Confirmation: ETT inserted through vocal cords under direct vision,  positive ETCO2 and breath sounds checked- equal and bilateral Secured at: 21 cm Tube secured with: Tape Dental Injury: Teeth and Oropharynx as per pre-operative assessment

## 2015-07-22 LAB — SURGICAL PATHOLOGY

## 2015-07-22 LAB — CYTOLOGY - NON PAP

## 2015-07-22 NOTE — Anesthesia Postprocedure Evaluation (Signed)
Anesthesia Post Note  Patient: Brooke Henson  Procedure(s) Performed: Procedure(s) (LRB): LAPAROSCOPIC LEFT SALPINGO OOPHORECTOMY, RIGHT SALPINGECTOMY (Bilateral) CYSTOSCOPY  Patient location during evaluation: PACU Anesthesia Type: General Level of consciousness: awake and alert Pain management: pain level controlled Vital Signs Assessment: post-procedure vital signs reviewed and stable Respiratory status: spontaneous breathing, nonlabored ventilation, respiratory function stable and patient connected to nasal cannula oxygen Cardiovascular status: blood pressure returned to baseline and stable Postop Assessment: no signs of nausea or vomiting Anesthetic complications: no    Last Vitals:  Filed Vitals:   07/21/15 1203 07/21/15 1236  BP: 128/70 130/74  Pulse: 71 74  Temp:    Resp:  16    Last Pain:  Filed Vitals:   07/22/15 0820  PainSc: [place] Tyreanna Bisesi

## 2015-08-19 ENCOUNTER — Other Ambulatory Visit: Payer: Self-pay | Admitting: Obstetrics and Gynecology

## 2015-08-19 DIAGNOSIS — Z1231 Encounter for screening mammogram for malignant neoplasm of breast: Secondary | ICD-10-CM

## 2015-09-21 ENCOUNTER — Ambulatory Visit (INDEPENDENT_AMBULATORY_CARE_PROVIDER_SITE_OTHER): Payer: Managed Care, Other (non HMO) | Admitting: Unknown Physician Specialty

## 2015-09-21 ENCOUNTER — Encounter: Payer: Self-pay | Admitting: Unknown Physician Specialty

## 2015-09-21 VITALS — BP 130/81 | HR 87 | Temp 98.2°F | Ht 71.5 in | Wt 244.4 lb

## 2015-09-21 DIAGNOSIS — Z0000 Encounter for general adult medical examination without abnormal findings: Secondary | ICD-10-CM

## 2015-09-21 NOTE — Progress Notes (Signed)
BP 130/81 (BP Location: Left Arm, Patient Position: Sitting, Cuff Size: Large)   Pulse 87   Temp 98.2 F (36.8 C)   Ht 5' 11.5" (1.816 m)   Wt 244 lb 6.4 oz (110.9 kg)   LMP 07/31/2015 (Approximate)   SpO2 95%   BMI 33.61 kg/m    Subjective:    Patient ID: Brooke Henson, female    DOB: May 06, 1965, 50 y.o.   MRN: [id number]  HPI: Brooke Henson is a 50 y.o. female   Pt is s/p colonoscopy and oopherectomy.  She is having menstrual period.    Chief Complaint  Patient presents with  . Annual Exam    HIV order entered    Social History   Social History  . Marital status: Single    Spouse name: N/A  . Number of children: N/A  . Years of education: N/A   Occupational History  . Not on file.   Social History Main Topics  . Smoking status: Never Smoker  . Smokeless tobacco: Never Used  . Alcohol use 1.2 oz/week    1 Glasses of wine, 1 Cans of beer per week     Comment:  occ  . Drug use: No  . Sexual activity: No   Other Topics Concern  . Not on file   Social History Narrative  . No narrative on file   Past Medical History:  Diagnosis Date  . IFG (impaired fasting glucose)   . Medical history non-contributory   . Menorrhagia    Family History  Problem Relation Age of Onset  . Endometrial cancer Mother   . Kidney cancer Mother   . Cancer Mother     uterine  . Hypertension Mother   . Breast cancer Paternal Grandmother   . Parkinson's disease Father   . Heart disease Maternal Grandmother     CHF  . Arthritis Maternal Grandmother   . Osteoporosis Maternal Grandmother   . Hypertension Maternal Grandfather   . Heart disease Maternal Grandfather   . Cancer Maternal Grandfather     bladder and pancreatic  . Ulcers Paternal Grandfather     Relevant past medical, surgical, family and social history reviewed and updated as indicated. Interim medical history since our last visit reviewed. Allergies and medications reviewed and updated.  Review of Systems    Constitutional: Negative.   HENT: Negative.   Eyes: Negative.   Respiratory: Negative.   Cardiovascular: Negative.   Gastrointestinal: Negative.   Endocrine: Negative.   Genitourinary: Negative.   Musculoskeletal: Negative.   Skin: Negative.   Allergic/Immunologic: Negative.   Neurological: Negative.   Hematological: Negative.   Psychiatric/Behavioral: Negative.     Per HPI unless specifically indicated above     Objective:    BP 130/81 (BP Location: Left Arm, Patient Position: Sitting, Cuff Size: Large)   Pulse 87   Temp 98.2 F (36.8 C)   Ht 5' 11.5" (1.816 m)   Wt 244 lb 6.4 oz (110.9 kg)   LMP 07/31/2015 (Approximate)   SpO2 95%   BMI 33.61 kg/m   Wt Readings from Last 3 Encounters:  09/21/15 244 lb 6.4 oz (110.9 kg)  07/07/15 245 lb 13 oz (111.5 kg)  05/14/15 234 lb (106.1 kg)    Physical Exam  Constitutional: She is oriented to person, place, and time. She appears well-developed and well-nourished.  HENT:  Head: Normocephalic and atraumatic.  Eyes: Pupils are equal, round, and reactive to light. Right eye exhibits no discharge. Left eye exhibits  no discharge. No scleral icterus.  Neck: Normal range of motion. Neck supple. Carotid bruit is not present. No thyromegaly present.  Cardiovascular: Normal rate, regular rhythm and normal heart sounds.  Exam reveals no gallop and no friction rub.   No murmur heard. Pulmonary/Chest: Effort normal and breath sounds normal. No respiratory distress. She has no wheezes. She has no rales.  Abdominal: Soft. Bowel sounds are normal. There is no tenderness. There is no rebound.  Genitourinary: No breast swelling, tenderness or discharge.  Musculoskeletal: Normal range of motion.  Lymphadenopathy:    She has no cervical adenopathy.  Neurological: She is alert and oriented to person, place, and time.  Skin: Skin is warm, dry and intact. No rash noted.  Psychiatric: She has a normal mood and affect. Her speech is normal and  behavior is normal. Judgment and thought content normal. Cognition and memory are normal.      Assessment & Plan:   Problem List Items Addressed This Visit    None    Visit Diagnoses    Health care maintenance    -  Primary   Relevant Orders   HIV antibody   Comprehensive metabolic panel   CBC with Differential/Platelet   TSH   Lipid Panel w/o Chol/HDL Ratio       Follow up plan: Return if symptoms worsen or fail to improve.

## 2015-09-22 LAB — CBC WITH DIFFERENTIAL/PLATELET
BASOS ABS: 0 10*3/uL (ref 0.0–0.2)
Basos: 0 %
EOS (ABSOLUTE): 0.1 10*3/uL (ref 0.0–0.4)
Eos: 2 %
Hematocrit: 39.2 % (ref 34.0–46.6)
Hemoglobin: 12.4 g/dL (ref 11.1–15.9)
IMMATURE GRANULOCYTES: 0 %
Immature Grans (Abs): 0 10*3/uL (ref 0.0–0.1)
Lymphocytes Absolute: 1 10*3/uL (ref 0.7–3.1)
Lymphs: 22 %
MCH: 28 pg (ref 26.6–33.0)
MCHC: 31.6 g/dL (ref 31.5–35.7)
MCV: 89 fL (ref 79–97)
MONOS ABS: 0.3 10*3/uL (ref 0.1–0.9)
Monocytes: 6 %
NEUTROS PCT: 70 %
Neutrophils Absolute: 3.3 10*3/uL (ref 1.4–7.0)
PLATELETS: 147 10*3/uL — AB (ref 150–379)
RBC: 4.43 x10E6/uL (ref 3.77–5.28)
RDW: 14.5 % (ref 12.3–15.4)
WBC: 4.7 10*3/uL (ref 3.4–10.8)

## 2015-09-22 LAB — HIV ANTIBODY (ROUTINE TESTING W REFLEX): HIV SCREEN 4TH GENERATION: NONREACTIVE

## 2015-09-22 LAB — LIPID PANEL W/O CHOL/HDL RATIO
CHOLESTEROL TOTAL: 190 mg/dL (ref 100–199)
HDL: 50 mg/dL (ref >39)
LDL Calculated: 117 mg/dL — ABNORMAL HIGH (ref 0–99)
TRIGLYCERIDES: 116 mg/dL (ref 0–149)
VLDL Cholesterol Cal: 23 mg/dL (ref 5–40)

## 2015-09-22 LAB — COMPREHENSIVE METABOLIC PANEL
A/G RATIO: 1.8 (ref 1.2–2.2)
ALK PHOS: 65 IU/L (ref 39–117)
ALT: 15 IU/L (ref 0–32)
AST: 15 IU/L (ref 0–40)
Albumin: 4.2 g/dL (ref 3.5–5.5)
BUN/Creatinine Ratio: 22 (ref 9–23)
BUN: 16 mg/dL (ref 6–24)
Bilirubin Total: 0.2 mg/dL (ref 0.0–1.2)
CO2: 25 mmol/L (ref 18–29)
Calcium: 9.5 mg/dL (ref 8.7–10.2)
Chloride: 103 mmol/L (ref 96–106)
Creatinine, Ser: 0.72 mg/dL (ref 0.57–1.00)
GFR calc Af Amer: 115 mL/min/{1.73_m2} (ref >59)
GFR calc non Af Amer: 100 mL/min/{1.73_m2} (ref >59)
GLOBULIN, TOTAL: 2.3 g/dL (ref 1.5–4.5)
Glucose: 98 mg/dL (ref 65–99)
POTASSIUM: 4.7 mmol/L (ref 3.5–5.2)
SODIUM: 144 mmol/L (ref 134–144)
Total Protein: 6.5 g/dL (ref 6.0–8.5)

## 2015-09-22 LAB — TSH: TSH: 1.59 u[IU]/mL (ref 0.450–4.500)

## 2015-09-24 ENCOUNTER — Ambulatory Visit
Admission: RE | Admit: 2015-09-24 | Discharge: 2015-09-24 | Disposition: A | Discharge status: Home / Self Care | Payer: Managed Care, Other (non HMO) | Source: Ambulatory Visit | Attending: Obstetrics and Gynecology | Admitting: Obstetrics and Gynecology

## 2015-09-24 ENCOUNTER — Other Ambulatory Visit: Payer: Self-pay | Admitting: Obstetrics and Gynecology

## 2015-09-24 DIAGNOSIS — Z1231 Encounter for screening mammogram for malignant neoplasm of breast: Secondary | ICD-10-CM | POA: Diagnosis not present

## 2016-09-19 ENCOUNTER — Other Ambulatory Visit: Payer: Self-pay | Admitting: Obstetrics and Gynecology

## 2016-09-19 DIAGNOSIS — Z1231 Encounter for screening mammogram for malignant neoplasm of breast: Secondary | ICD-10-CM

## 2016-10-03 ENCOUNTER — Ambulatory Visit
Admission: RE | Admit: 2016-10-03 | Discharge: 2016-10-03 | Disposition: A | Discharge status: Home / Self Care | Payer: Managed Care, Other (non HMO) | Source: Ambulatory Visit | Attending: Obstetrics and Gynecology | Admitting: Obstetrics and Gynecology

## 2016-10-03 DIAGNOSIS — R928 Other abnormal and inconclusive findings on diagnostic imaging of breast: Secondary | ICD-10-CM | POA: Insufficient documentation

## 2016-10-03 DIAGNOSIS — Z1231 Encounter for screening mammogram for malignant neoplasm of breast: Secondary | ICD-10-CM | POA: Diagnosis not present

## 2016-10-03 DIAGNOSIS — N6310 Unspecified lump in the right breast, unspecified quadrant: Secondary | ICD-10-CM | POA: Insufficient documentation

## 2016-10-05 ENCOUNTER — Other Ambulatory Visit: Payer: Self-pay | Admitting: Obstetrics and Gynecology

## 2016-10-05 DIAGNOSIS — R928 Other abnormal and inconclusive findings on diagnostic imaging of breast: Secondary | ICD-10-CM

## 2016-10-05 DIAGNOSIS — N6310 Unspecified lump in the right breast, unspecified quadrant: Secondary | ICD-10-CM

## 2016-10-09 ENCOUNTER — Encounter: Payer: Self-pay | Admitting: Obstetrics and Gynecology

## 2016-10-09 ENCOUNTER — Ambulatory Visit (INDEPENDENT_AMBULATORY_CARE_PROVIDER_SITE_OTHER): Payer: Managed Care, Other (non HMO) | Admitting: Obstetrics and Gynecology

## 2016-10-09 VITALS — BP 122/90 | HR 79 | Ht 71.0 in | Wt 248.0 lb

## 2016-10-09 DIAGNOSIS — Z1239 Encounter for other screening for malignant neoplasm of breast: Secondary | ICD-10-CM

## 2016-10-09 DIAGNOSIS — R928 Other abnormal and inconclusive findings on diagnostic imaging of breast: Secondary | ICD-10-CM

## 2016-10-09 DIAGNOSIS — Z01419 Encounter for gynecological examination (general) (routine) without abnormal findings: Secondary | ICD-10-CM

## 2016-10-09 DIAGNOSIS — Z1231 Encounter for screening mammogram for malignant neoplasm of breast: Secondary | ICD-10-CM | POA: Diagnosis not present

## 2016-10-09 DIAGNOSIS — Z1211 Encounter for screening for malignant neoplasm of colon: Secondary | ICD-10-CM

## 2016-10-09 DIAGNOSIS — Z124 Encounter for screening for malignant neoplasm of cervix: Secondary | ICD-10-CM | POA: Diagnosis not present

## 2016-10-09 NOTE — Patient Instructions (Signed)
Preventive Care 40-64 Years, Female Preventive care refers to lifestyle choices and visits with your health care provider that can promote health and wellness. What does preventive care include?  A yearly physical exam. This is also called an annual well check.  Dental exams once or twice a year.  Routine eye exams. Ask your health care provider how often you should have your eyes checked.  Personal lifestyle choices, including: ? Daily care of your teeth and gums. ? Regular physical activity. ? Eating a healthy diet. ? Avoiding tobacco and drug use. ? Limiting alcohol use. ? Practicing safe sex. ? Taking low-dose aspirin daily starting at age 58. ? Taking vitamin and mineral supplements as recommended by your health care provider. What happens during an annual well check? The services and screenings done by your health care provider during your annual well check will depend on your age, overall health, lifestyle risk factors, and family history of disease. Counseling Your health care provider may ask you questions about your:  Alcohol use.  Tobacco use.  Drug use.  Emotional well-being.  Home and relationship well-being.  Sexual activity.  Eating habits.  Work and work Statistician.  Method of birth control.  Menstrual cycle.  Pregnancy history.  Screening You may have the following tests or measurements:  Height, weight, and BMI.  Blood pressure.  Lipid and cholesterol levels. These may be checked every 5 years, or more frequently if you are over 81 years old.  Skin check.  Lung cancer screening. You may have this screening every year starting at age 78 if you have a 30-pack-year history of smoking and currently smoke or have quit within the past 15 years.  Fecal occult blood test (FOBT) of the stool. You may have this test every year starting at age 65.  Flexible sigmoidoscopy or colonoscopy. You may have a sigmoidoscopy every 5 years or a colonoscopy  every 10 years starting at age 30.  Hepatitis C blood test.  Hepatitis B blood test.  Sexually transmitted disease (STD) testing.  Diabetes screening. This is done by checking your blood sugar (glucose) after you have not eaten for a while (fasting). You may have this done every 1-3 years.  Mammogram. This may be done every 1-2 years. Talk to your health care provider about when you should start having regular mammograms. This may depend on whether you have a family history of breast cancer.  BRCA-related cancer screening. This may be done if you have a family history of breast, ovarian, tubal, or peritoneal cancers.  Pelvic exam and Pap test. This may be done every 3 years starting at age 80. Starting at age 36, this may be done every 5 years if you have a Pap test in combination with an HPV test.  Bone density scan. This is done to screen for osteoporosis. You may have this scan if you are at high risk for osteoporosis.  Discuss your test results, treatment options, and if necessary, the need for more tests with your health care provider. Vaccines Your health care provider may recommend certain vaccines, such as:  Influenza vaccine. This is recommended every year.  Tetanus, diphtheria, and acellular pertussis (Tdap, Td) vaccine. You may need a Td booster every 10 years.  Varicella vaccine. You may need this if you have not been vaccinated.  Zoster vaccine. You may need this after age 5.  Measles, mumps, and rubella (MMR) vaccine. You may need at least one dose of MMR if you were born in  1957 or later. You may also need a second dose.  Pneumococcal 13-valent conjugate (PCV13) vaccine. You may need this if you have certain conditions and were not previously vaccinated.  Pneumococcal polysaccharide (PPSV23) vaccine. You may need one or two doses if you smoke cigarettes or if you have certain conditions.  Meningococcal vaccine. You may need this if you have certain  conditions.  Hepatitis A vaccine. You may need this if you have certain conditions or if you travel or work in places where you may be exposed to hepatitis A.  Hepatitis B vaccine. You may need this if you have certain conditions or if you travel or work in places where you may be exposed to hepatitis B.  Haemophilus influenzae type b (Hib) vaccine. You may need this if you have certain conditions.  Talk to your health care provider about which screenings and vaccines you need and how often you need them. This information is not intended to replace advice given to you by your health care provider. Make sure you discuss any questions you have with your health care provider. Document Released: 02/05/2015 Document Revised: 09/29/2015 Document Reviewed: 11/10/2014 Elsevier Interactive Patient Education  2017 [employer].

## 2016-10-09 NOTE — Progress Notes (Signed)
Patient ID: Brooke Henson, female   DOB: 07/24/1965, 51 y.o.   MRN: [id number]    Gynecology Annual Exam  PCP: Kathrine Haddock, NP  Chief Complaint:  Chief Complaint  Patient presents with  . Gynecologic Exam    no cycle since May    History of Present Illness: Patient is a 51 y.o. G1P0010 presents for annual exam. The patient has no complaints today.   LMP: Patient's last menstrual period was 06/22/2016. Menses have been spacing out over the past year, no intermenstrual spotting.  Last menses may of this year.  Mild vasomotor symptoms.   The patient is sexually active. She currently uses none for contraception. She denies dyspareunia.  The patient does perform self breast exams.  There is no notable family history of breast or ovarian cancer in her family.  The patient wears seatbelts: yes.   The patient has regular exercise: not asked.    The patient denies current symptoms of depression.    Review of Systems: Review of Systems  Constitutional: Negative for chills and fever.  HENT: Negative for congestion.   Respiratory: Negative for cough and shortness of breath.   Cardiovascular: Negative for chest pain and palpitations.  Gastrointestinal: Negative for abdominal pain, constipation, diarrhea, heartburn, nausea and vomiting.  Genitourinary: Negative for dysuria, frequency and urgency.  Skin: Negative for itching and rash.  Neurological: Negative for dizziness and headaches.  Endo/Heme/Allergies: Negative for polydipsia.  Psychiatric/Behavioral: Negative for depression.    Past Medical History:  Past Medical History:  Diagnosis Date  . IFG (impaired fasting glucose)   . Medical history non-contributory   . Menorrhagia     Past Surgical History:  Past Surgical History:  Procedure Laterality Date  . COLONOSCOPY WITH PROPOFOL N/A 05/14/2015   Procedure: COLONOSCOPY WITH PROPOFOL;  Surgeon: Lucilla Lame, MD;  Location: [place];  Service: Endoscopy;  Laterality:  N/A;  . CYSTOSCOPY  07/21/2015   Procedure: CYSTOSCOPY;  Surgeon: Malachy Mood, MD;  Location: [facility];  Service: Gynecology;;  . DILATION AND CURETTAGE OF UTERUS  04/2008   Hysteroscopy; endometrial polyps  . LAPAROSCOPIC SALPINGO OOPHERECTOMY Bilateral 07/21/2015   Procedure: LAPAROSCOPIC LEFT SALPINGO OOPHORECTOMY, RIGHT SALPINGECTOMY;  Surgeon: Malachy Mood, MD;  Location: [facility];  Service: Gynecology;  Laterality: Bilateral;  . MOUTH SURGERY      Gynecologic History:  Patient's last menstrual period was 06/22/2016. Contraception: none Last Pap: Results were: 03/11/15 NIL and HR HPV negative  Last mammogram: 10/03/16 Results were: BI-RAD 0 Obstetric History: G1P0010  Family History:  Family History  Problem Relation Age of Onset  . Endometrial cancer Mother   . Kidney cancer Mother   . Cancer Mother        uterine  . Hypertension Mother   . Breast cancer Paternal Grandmother   . Parkinson's disease Father   . Heart disease Maternal Grandmother        CHF  . Arthritis Maternal Grandmother   . Osteoporosis Maternal Grandmother   . Hypertension Maternal Grandfather   . Heart disease Maternal Grandfather   . Cancer Maternal Grandfather        bladder and pancreatic  . Ulcers Paternal Grandfather     Social History:  Social History   Social History  . Marital status: Single    Spouse name: N/A  . Number of children: N/A  . Years of education: N/A   Occupational History  . Not on file.   Social History Main Topics  . Smoking status: Never  Smoker  . Smokeless tobacco: Never Used  . Alcohol use 1.2 oz/week    1 Glasses of wine, 1 Cans of beer per week     Comment:  occ  . Drug use: No  . Sexual activity: No   Other Topics Concern  . Not on file   Social History Narrative  . No narrative on file    Allergies:  Allergies  Allergen Reactions  . Amoxicillin Diarrhea and Nausea Only    Medications: Prior to Admission medications   Medication Sig  Start Date End Date Taking? Authorizing Provider  Ascorbic Acid (VITAMIN C PO) Take 1 tablet by mouth daily.    Yes [provider]  ibuprofen (ADVIL,MOTRIN) 600 MG tablet Take 1 tablet (600 mg total) by mouth every 6 (six) hours as needed. 07/21/15  Yes Malachy Mood, MD  Multiple Vitamin (MULTI VITAMIN DAILY PO) Take 1 tablet by mouth daily.    Yes [provider]  Probiotic Product (PROBIOTIC PO) Take 1 tablet by mouth daily as needed.   Yes [provider]    Physical Exam Vitals: Blood pressure 122/90, pulse 79, height 5\' 11"  (1.803 m), weight 248 lb (112.5 kg), last menstrual period 06/22/2016.  General: NAD HEENT: normocephalic, anicteric Thyroid: no enlargement, no palpable nodules Pulmonary: No increased work of breathing, CTAB Cardiovascular: RRR, distal pulses 2+ Breast: Breast symmetrical, no tenderness, no palpable nodules or masses, no skin or nipple retraction present, no nipple discharge.  No axillary or supraclavicular lymphadenopathy. Abdomen: NABS, soft, non-tender, non-distended.  Umbilicus without lesions.  No hepatomegaly, splenomegaly or masses palpable. No evidence of hernia  Genitourinary:  External: Normal external female genitalia.  Normal urethral meatus, normal  Bartholin's and Skene's glands.    Vagina: Normal vaginal mucosa, no evidence of prolapse.    Cervix: Grossly normal in appearance, no bleeding  Uterus: Non-enlarged, mobile, normal contour.  No CMT  Adnexa: ovaries non-enlarged, no adnexal masses  Rectal: deferred  Lymphatic: no evidence of inguinal lymphadenopathy Extremities: no edema, erythema, or tenderness Neurologic: Grossly intact Psychiatric: mood appropriate, affect full  Female chaperone present for pelvic and breast  portions of the physical exam    Assessment: 51 y.o. G1P0010 routine annual exam  Plan: Problem List Items Addressed This Visit    None    Visit Diagnoses    Abnormal screening  mammogram    -  Primary   Relevant Orders   MM DIAG BREAST TOMO UNI RIGHT   [place] Unlisted Procedure Breast   Screening for malignant neoplasm of cervix       Relevant Orders   PapIG, HPV, rfx 16/18   Breast screening       Special screening for malignant neoplasms, colon       Encounter for gynecological examination without abnormal finding       Relevant Orders   PapIG, HPV, rfx 16/18      1) Mammogram - recommend yearly screening mammogram.  Mammogram follow up mammogram pending  2) STI screening was not offered  3) ASCCP guidelines and rational discussed.  Patient opts for every 5 years screening interval  4) Contraception - amenorrhea since May 2018 likely menopausal, mild vasomotor symptoms  5) Colonoscopy -- Screening recommended starting at age 85 for average risk individuals, age 51 for individuals deemed at increased risk (including African Americans) and recommended to continue until age 57.  For patient age 4-85 individualized approach is recommended.  Gold standard screening is via colonoscopy, Cologuard screening is an  acceptable alternative for patient unwilling or unable to undergo colonoscopy.  "Colorectal cancer screening for average?risk adults: 2018 guideline update from the [place]: A Cancer Journal for Clinicians: Jun 21, 2016  - normal March 2018 good till March 2028  6) Routine healthcare maintenance including cholesterol, diabetes screening discussed managed by PCP

## 2016-10-11 LAB — PAPIG, HPV, RFX 16/18
HPV, HIGH-RISK: NEGATIVE
PAP Smear Comment: 0

## 2016-10-19 ENCOUNTER — Ambulatory Visit
Admission: RE | Admit: 2016-10-19 | Discharge: 2016-10-19 | Disposition: A | Discharge status: Home / Self Care | Payer: Managed Care, Other (non HMO) | Source: Ambulatory Visit | Attending: Obstetrics and Gynecology | Admitting: Obstetrics and Gynecology

## 2016-10-19 DIAGNOSIS — N6001 Solitary cyst of right breast: Secondary | ICD-10-CM | POA: Insufficient documentation

## 2016-10-19 DIAGNOSIS — N6310 Unspecified lump in the right breast, unspecified quadrant: Secondary | ICD-10-CM

## 2016-10-19 DIAGNOSIS — R928 Other abnormal and inconclusive findings on diagnostic imaging of breast: Secondary | ICD-10-CM

## 2016-10-20 ENCOUNTER — Encounter: Payer: Self-pay | Admitting: Obstetrics and Gynecology

## 2017-09-11 ENCOUNTER — Other Ambulatory Visit: Payer: Self-pay | Admitting: Obstetrics and Gynecology

## 2017-09-11 DIAGNOSIS — Z1231 Encounter for screening mammogram for malignant neoplasm of breast: Secondary | ICD-10-CM

## 2017-09-19 ENCOUNTER — Encounter: Payer: Self-pay | Admitting: Family Medicine

## 2017-09-19 ENCOUNTER — Ambulatory Visit (INDEPENDENT_AMBULATORY_CARE_PROVIDER_SITE_OTHER): Payer: Managed Care, Other (non HMO) | Admitting: Family Medicine

## 2017-09-19 VITALS — BP 142/93 | HR 90 | Ht 71.0 in | Wt 258.0 lb

## 2017-09-19 DIAGNOSIS — I10 Essential (primary) hypertension: Secondary | ICD-10-CM

## 2017-09-19 LAB — MICROSCOPIC EXAMINATION: Bacteria, UA: NONE SEEN

## 2017-09-19 LAB — URINALYSIS, ROUTINE W REFLEX MICROSCOPIC
BILIRUBIN UA: NEGATIVE
Glucose, UA: NEGATIVE
KETONES UA: NEGATIVE
NITRITE UA: NEGATIVE
Protein, UA: NEGATIVE
Specific Gravity, UA: 1.02 (ref 1.005–1.030)
UUROB: 0.2 mg/dL (ref 0.2–1.0)
pH, UA: 5 (ref 5.0–7.5)

## 2017-09-19 MED ORDER — BENAZEPRIL HCL 40 MG PO TABS: 40.0000 mg | ORAL_TABLET | Freq: Every day | ORAL | 3 refills | Status: DC

## 2017-09-19 NOTE — Progress Notes (Signed)
   BP (!) 142/93   Pulse 90   Ht 5\' 11"  (1.803 m)   Wt 258 lb (117 kg)   SpO2 98%   BMI 35.98 kg/m    Subjective:    Patient ID: Brooke Henson, female    DOB: 1965/02/05, 52 y.o.   MRN: [id number]  HPI: Brooke Henson is a 52 y.o. female  Chief Complaint  Patient presents with  . Hypertension    Pt states she has a high stress job. Woke up this A.M with nosebleed.  Patient currently undergoing a great deal of stress but has noted blood pressure is been elevated over this last month or so.  Hopefully stress will be meeting with him the next month. Patient is gained weight over this last year. Reviewed hypertension diagnosis which was previously at heart but no prior elevated blood pressure readings are treatment for hypertension.  Relevant past medical, surgical, family and social history reviewed and updated as indicated. Interim medical history since our last visit reviewed. Allergies and medications reviewed and updated.  Review of Systems  Constitutional: Negative.   Respiratory: Negative.   Cardiovascular: Negative.     Per HPI unless specifically indicated above     Objective:    BP (!) 142/93   Pulse 90   Ht 5\' 11"  (1.803 m)   Wt 258 lb (117 kg)   SpO2 98%   BMI 35.98 kg/m   Wt Readings from Last 3 Encounters:  09/19/17 258 lb (117 kg)  10/09/16 248 lb (112.5 kg)  09/21/15 244 lb 6.4 oz (110.9 kg)    Physical Exam  Constitutional: She is oriented to person, place, and time. She appears well-developed and well-nourished.  HENT:  Head: Normocephalic and atraumatic.  Eyes: Conjunctivae and EOM are normal.  Neck: Normal range of motion.  Cardiovascular: Normal rate, regular rhythm and normal heart sounds.  Pulmonary/Chest: Effort normal and breath sounds normal.  Musculoskeletal: Normal range of motion.  Neurological: She is alert and oriented to person, place, and time.  Skin: No erythema.  Psychiatric: She has a normal mood and affect. Her behavior is  normal. Judgment and thought content normal.  EKG with no acute changes normal sinus rhythm      Assessment & Plan:   Problem List Items Addressed This Visit      Cardiovascular and Mediastinum   Hypertension - Primary    Discussed hypertension risk treatment will start Benzapril 40 mg 1 a day recheck 1 month      Relevant Medications   aspirin EC 81 MG tablet   benazepril (LOTENSIN) 40 MG tablet   Other Relevant Orders   EKG 12-Lead (Completed)   Basic metabolic panel   Urinalysis, Routine w reflex microscopic       Follow up plan: Return in about 4 weeks (around 10/17/2017) for BMP.

## 2017-09-19 NOTE — Assessment & Plan Note (Signed)
Discussed hypertension risk treatment will start Benzapril 40 mg 1 a day recheck 1 month

## 2017-09-20 LAB — BASIC METABOLIC PANEL
BUN / CREAT RATIO: 20 (ref 9–23)
BUN: 16 mg/dL (ref 6–24)
CO2: 25 mmol/L (ref 20–29)
CREATININE: 0.8 mg/dL (ref 0.57–1.00)
Calcium: 9.6 mg/dL (ref 8.7–10.2)
Chloride: 105 mmol/L (ref 96–106)
GFR calc Af Amer: 100 mL/min/{1.73_m2} (ref >59)
GFR, EST NON AFRICAN AMERICAN: 87 mL/min/{1.73_m2} (ref >59)
Glucose: 132 mg/dL — ABNORMAL HIGH (ref 65–99)
POTASSIUM: 4.7 mmol/L (ref 3.5–5.2)
Sodium: 144 mmol/L (ref 134–144)

## 2017-09-20 NOTE — Progress Notes (Signed)
LVM for patient to return phone call.  

## 2017-09-27 ENCOUNTER — Encounter: Payer: Self-pay | Admitting: Unknown Physician Specialty

## 2017-09-27 ENCOUNTER — Ambulatory Visit (INDEPENDENT_AMBULATORY_CARE_PROVIDER_SITE_OTHER): Payer: Managed Care, Other (non HMO) | Admitting: Unknown Physician Specialty

## 2017-09-27 VITALS — BP 134/80 | HR 71 | Temp 97.9°F | Ht 71.0 in | Wt 256.4 lb

## 2017-09-27 DIAGNOSIS — R42 Dizziness and giddiness: Secondary | ICD-10-CM | POA: Diagnosis not present

## 2017-09-27 DIAGNOSIS — M542 Cervicalgia: Secondary | ICD-10-CM

## 2017-09-27 DIAGNOSIS — I10 Essential (primary) hypertension: Secondary | ICD-10-CM | POA: Diagnosis not present

## 2017-09-27 DIAGNOSIS — R0602 Shortness of breath: Secondary | ICD-10-CM | POA: Diagnosis not present

## 2017-09-27 MED ORDER — HYDROCHLOROTHIAZIDE 25 MG PO TABS: 25.0000 mg | ORAL_TABLET | Freq: Every day | ORAL | 3 refills | Status: DC

## 2017-09-27 NOTE — Assessment & Plan Note (Addendum)
Pt with intolerance to Benazepril.  Suspect it's the dose not the medication but will change classes.  No allergy signs such as rash and angioedema.  Change to HCTZ

## 2017-09-27 NOTE — Progress Notes (Signed)
BP 134/80   Pulse 71   Temp 97.9 F (36.6 C) (Oral)   Ht 5\' 11"  (1.803 m)   Wt 256 lb 6.4 oz (116.3 kg)   SpO2 99%   BMI 35.76 kg/m    Subjective:    Patient ID: Brooke Henson, female    DOB: 08/10/1965, 52 y.o.   MRN: [id number]  HPI: Brooke Henson is a 52 y.o. female  Chief Complaint  Patient presents with  . Hypertension    pt states she has been having side effects from benazepril    Hypertension Pt started on Benazepril last week at 40 mg for new diagnosis of hypertension.  Pt states she has been dizzy and feeling like she is going to pass out.  Having SOB.  Having tightness in neck and shoulders.  No rash or swelling.  Reviewed EKG done and within normal limits.Feeling better as skipped dose of medication today     Relevant past medical, surgical, family and social history reviewed and updated as indicated. Interim medical history since our last visit reviewed. Allergies and medications reviewed and updated.  Review of Systems  Constitutional: Positive for fatigue.  HENT: Negative.   Eyes: Negative.   Respiratory: Negative.   Cardiovascular: Negative.   Gastrointestinal: Negative.   Skin: Negative.   Neurological: Negative.   Psychiatric/Behavioral: Negative.     Per HPI unless specifically indicated above     Objective:    BP 134/80   Pulse 71   Temp 97.9 F (36.6 C) (Oral)   Ht 5\' 11"  (1.803 m)   Wt 256 lb 6.4 oz (116.3 kg)   SpO2 99%   BMI 35.76 kg/m   Wt Readings from Last 3 Encounters:  09/27/17 256 lb 6.4 oz (116.3 kg)  09/19/17 258 lb (117 kg)  10/09/16 248 lb (112.5 kg)    Physical Exam  Constitutional: She is oriented to person, place, and time. She appears well-developed and well-nourished.  HENT:  Head: Normocephalic and atraumatic.  Eyes: Pupils are equal, round, and reactive to light. Right eye exhibits no discharge. Left eye exhibits no discharge. No scleral icterus.  Neck: Normal range of motion. Neck supple. Carotid bruit is  not present. No thyromegaly present.  Cardiovascular: Normal rate, regular rhythm and normal heart sounds. Exam reveals no gallop and no friction rub.  No murmur heard. Pulmonary/Chest: Effort normal and breath sounds normal. No respiratory distress. She has no wheezes. She has no rales. No breast tenderness or discharge.  Abdominal: There is no tenderness. There is no rebound.  Genitourinary: No breast tenderness or discharge.  Musculoskeletal: Normal range of motion.  Lymphadenopathy:    She has no cervical adenopathy.  Neurological: She is alert and oriented to person, place, and time.  Skin: Skin is warm, dry and intact. No rash noted.  Psychiatric: She has a normal mood and affect. Her speech is normal and behavior is normal. Judgment and thought content normal. Cognition and memory are normal.   EKG NSR without changes from last week.   No orthostatic changes.    Results for orders placed or performed in visit on 09/19/17  Microscopic Examination  Result Value Ref Range   WBC, UA 0-5 0 - 5 /hpf   RBC, UA 0-2 0 - 2 /hpf   Epithelial Cells (non renal) 0-10 0 - 10 /hpf   Bacteria, UA None seen None seen/Few  Basic metabolic panel  Result Value Ref Range   Glucose 132 (H) 65 - 99 mg/dL  BUN 16 6 - 24 mg/dL   Creatinine, Ser 0.80 0.57 - 1.00 mg/dL   GFR calc non Af Amer 87 >59 mL/min/1.73   GFR calc Af Amer 100 >59 mL/min/1.73   BUN/Creatinine Ratio 20 9 - 23   Sodium 144 134 - 144 mmol/L   Potassium 4.7 3.5 - 5.2 mmol/L   Chloride 105 96 - 106 mmol/L   CO2 25 20 - 29 mmol/L   Calcium 9.6 8.7 - 10.2 mg/dL  Urinalysis, Routine w reflex microscopic  Result Value Ref Range   Specific Gravity, UA 1.020 1.005 - 1.030   pH, UA 5.0 5.0 - 7.5   Color, UA Yellow Yellow   Appearance Ur Hazy (A) Clear   Leukocytes, UA Trace (A) Negative   Protein, UA Negative Negative/Trace   Glucose, UA Negative Negative   Ketones, UA Negative Negative   RBC, UA 1+ (A) Negative   Bilirubin, UA  Negative Negative   Urobilinogen, Ur 0.2 0.2 - 1.0 mg/dL   Nitrite, UA Negative Negative   Microscopic Examination See below:       Assessment & Plan:   Problem List Items Addressed This Visit      Unprioritized   Hypertension - Primary    Pt with intolerance to Benazepril.  Suspect it's the dose not the medication but will change classes.  No allergy signs such as rash and angioedema.  Change to HCTZ      Relevant Medications   hydrochlorothiazide (HYDRODIURIL) 25 MG tablet   Other Relevant Orders   EKG 12-Lead (Completed)   Lipid Panel w/o Chol/HDL Ratio   Comprehensive metabolic panel   TSH    Other Visit Diagnoses    SOB (shortness of breath)       Improved.  EKG normal   Relevant Orders   EKG 12-Lead (Completed)   Neck pain       Improved now off of medications.     Relevant Orders   EKG 12-Lead (Completed)   Dizzy       Improved off of medications.  I suspect related to dose of medications.     Relevant Orders   CBC with Differential/Platelet       Follow up plan: Return for appt set for Sept 30.

## 2017-09-28 LAB — COMPREHENSIVE METABOLIC PANEL
A/G RATIO: 1.9 (ref 1.2–2.2)
ALK PHOS: 67 IU/L (ref 39–117)
ALT: 18 IU/L (ref 0–32)
AST: 12 IU/L (ref 0–40)
Albumin: 4.4 g/dL (ref 3.5–5.5)
BILIRUBIN TOTAL: 0.4 mg/dL (ref 0.0–1.2)
BUN / CREAT RATIO: 24 — AB (ref 9–23)
BUN: 17 mg/dL (ref 6–24)
CHLORIDE: 103 mmol/L (ref 96–106)
CO2: 25 mmol/L (ref 20–29)
Calcium: 9.5 mg/dL (ref 8.7–10.2)
Creatinine, Ser: 0.71 mg/dL (ref 0.57–1.00)
GFR calc Af Amer: 116 mL/min/{1.73_m2} (ref >59)
GFR calc non Af Amer: 100 mL/min/{1.73_m2} (ref >59)
GLOBULIN, TOTAL: 2.3 g/dL (ref 1.5–4.5)
Glucose: 101 mg/dL — ABNORMAL HIGH (ref 65–99)
Potassium: 4.3 mmol/L (ref 3.5–5.2)
SODIUM: 141 mmol/L (ref 134–144)
Total Protein: 6.7 g/dL (ref 6.0–8.5)

## 2017-09-28 LAB — CBC WITH DIFFERENTIAL/PLATELET
BASOS ABS: 0 10*3/uL (ref 0.0–0.2)
Basos: 0 %
EOS (ABSOLUTE): 0.1 10*3/uL (ref 0.0–0.4)
EOS: 2 %
HEMATOCRIT: 38.3 % (ref 34.0–46.6)
Hemoglobin: 12.7 g/dL (ref 11.1–15.9)
Immature Grans (Abs): 0 10*3/uL (ref 0.0–0.1)
Immature Granulocytes: 0 %
LYMPHS ABS: 1 10*3/uL (ref 0.7–3.1)
Lymphs: 23 %
MCH: 28.2 pg (ref 26.6–33.0)
MCHC: 33.2 g/dL (ref 31.5–35.7)
MCV: 85 fL (ref 79–97)
MONOS ABS: 0.2 10*3/uL (ref 0.1–0.9)
Monocytes: 5 %
Neutrophils Absolute: 2.9 10*3/uL (ref 1.4–7.0)
Neutrophils: 70 %
Platelets: 156 10*3/uL (ref 150–450)
RBC: 4.5 x10E6/uL (ref 3.77–5.28)
RDW: 14.3 % (ref 12.3–15.4)
WBC: 4.1 10*3/uL (ref 3.4–10.8)

## 2017-09-28 LAB — LIPID PANEL W/O CHOL/HDL RATIO
Cholesterol, Total: 179 mg/dL (ref 100–199)
HDL: 44 mg/dL (ref >39)
LDL Calculated: 116 mg/dL — ABNORMAL HIGH (ref 0–99)
TRIGLYCERIDES: 94 mg/dL (ref 0–149)
VLDL Cholesterol Cal: 19 mg/dL (ref 5–40)

## 2017-09-28 LAB — TSH: TSH: 1.02 u[IU]/mL (ref 0.450–4.500)

## 2017-09-28 NOTE — Progress Notes (Signed)
Normal labs.  Pt notified through mychart

## 2017-10-04 ENCOUNTER — Ambulatory Visit
Admission: RE | Admit: 2017-10-04 | Discharge: 2017-10-04 | Disposition: A | Discharge status: Home / Self Care | Payer: Managed Care, Other (non HMO) | Source: Ambulatory Visit | Attending: Obstetrics and Gynecology | Admitting: Obstetrics and Gynecology

## 2017-10-04 DIAGNOSIS — Z1231 Encounter for screening mammogram for malignant neoplasm of breast: Secondary | ICD-10-CM | POA: Diagnosis not present

## 2017-10-22 ENCOUNTER — Ambulatory Visit (INDEPENDENT_AMBULATORY_CARE_PROVIDER_SITE_OTHER): Payer: Managed Care, Other (non HMO) | Admitting: Physician Assistant

## 2017-10-22 ENCOUNTER — Encounter: Payer: Self-pay | Admitting: Physician Assistant

## 2017-10-22 VITALS — BP 138/83 | HR 84 | Temp 98.4°F | Ht 71.0 in | Wt 251.0 lb

## 2017-10-22 DIAGNOSIS — I10 Essential (primary) hypertension: Secondary | ICD-10-CM

## 2017-10-22 NOTE — Progress Notes (Signed)
Subjective:    Patient ID: Brooke Henson, female    DOB: 1965-05-04, 52 y.o.   MRN: [id number]  Brooke Henson is a 52 y.o. female presenting on 10/22/2017 for Hypertension (1 month f/up)   HPI   Initially started on 40 mg benazepril daily for HTN, experienced dizziness with this and was changed to 25 mg HCTZ daily. Denies issues with this. Dizziness has resolved when benazepril was D/C. Denies chest pain and SOB.  BP Readings from Last 3 Encounters:  10/22/17 138/83  09/27/17 134/80  09/19/17 (!) 142/93    Social History   Tobacco Use  . Smoking status: Never Smoker  . Smokeless tobacco: Never Used  Substance Use Topics  . Alcohol use: Yes    Alcohol/week: 2.0 standard drinks    Types: 1 Glasses of wine, 1 Cans of beer per week    Comment:  occ  . Drug use: No    Review of Systems Per HPI unless specifically indicated above     Objective:    BP 138/83   Pulse 84   Temp 98.4 F (36.9 C) (Oral)   Ht _0  (1.803 m)   Wt 251 lb (113.9 kg)   SpO2 98%   BMI 35.01 kg/m   Wt Readings from Last 3 Encounters:  10/22/17 251 lb (113.9 kg)  09/27/17 256 lb 6.4 oz (116.3 kg)  09/19/17 258 lb (117 kg)    Physical Exam  Constitutional: She is oriented to person, place, and time. She appears well-developed and well-nourished.  Cardiovascular: Normal rate and regular rhythm.  Pulmonary/Chest: Effort normal and breath sounds normal.  Neurological: She is alert and oriented to person, place, and time.  Skin: Skin is warm and dry.  Psychiatric: She has a normal mood and affect. Her behavior is normal.   Results for orders placed or performed in visit on 09/27/17  Lipid Panel w/o Chol/HDL Ratio  Result Value Ref Range   Cholesterol, Total 179 100 - 199 mg/dL   Triglycerides 94 0 - 149 mg/dL   HDL 44 >39 mg/dL   VLDL Cholesterol Cal 19 5 - 40 mg/dL   LDL Calculated 116 (H) 0 - 99 mg/dL  Comprehensive metabolic panel  Result Value Ref Range   Glucose 101 (H) 65 - 99  mg/dL   BUN 17 6 - 24 mg/dL   Creatinine, Ser 0.71 0.57 - 1.00 mg/dL   GFR calc non Af Amer 100 >59 mL/min/1.73   GFR calc Af Amer 116 >59 mL/min/1.73   BUN/Creatinine Ratio 24 (H) 9 - 23   Sodium 141 134 - 144 mmol/L   Potassium 4.3 3.5 - 5.2 mmol/L   Chloride 103 96 - 106 mmol/L   CO2 25 20 - 29 mmol/L   Calcium 9.5 8.7 - 10.2 mg/dL   Total Protein 6.7 6.0 - 8.5 g/dL   Albumin 4.4 3.5 - 5.5 g/dL   Globulin, Total 2.3 1.5 - 4.5 g/dL   Albumin/Globulin Ratio 1.9 1.2 - 2.2   Bilirubin Total 0.4 0.0 - 1.2 mg/dL   Alkaline Phosphatase 67 39 - 117 IU/L   AST 12 0 - 40 IU/L   ALT 18 0 - 32 IU/L  CBC with Differential/Platelet  Result Value Ref Range   WBC 4.1 3.4 - 10.8 x10E3/uL   RBC 4.50 3.77 - 5.28 x10E6/uL   Hemoglobin 12.7 11.1 - 15.9 g/dL   Hematocrit 38.3 34.0 - 46.6 %   MCV 85 79 - 97 fL   MCH  28.2 26.6 - 33.0 pg   MCHC 33.2 31.5 - 35.7 g/dL   RDW 14.3 12.3 - 15.4 %   Platelets 156 150 - 450 x10E3/uL   Neutrophils 70 Not Estab. %   Lymphs 23 Not Estab. %   Monocytes 5 Not Estab. %   Eos 2 Not Estab. %   Basos 0 Not Estab. %   Neutrophils Absolute 2.9 1.4 - 7.0 x10E3/uL   Lymphocytes Absolute 1.0 0.7 - 3.1 x10E3/uL   Monocytes Absolute 0.2 0.1 - 0.9 x10E3/uL   EOS (ABSOLUTE) 0.1 0.0 - 0.4 x10E3/uL   Basophils Absolute 0.0 0.0 - 0.2 x10E3/uL   Immature Granulocytes 0 Not Estab. %   Immature Grans (Abs) 0.0 0.0 - 0.1 x10E3/uL  TSH  Result Value Ref Range   TSH 1.020 0.450 - 4.500 uIU/mL      Assessment & Plan:  1. Essential hypertension  Gave option to add medication or wait until follow up to see if lifestyle interventions will help.  - Comp Met (CMET)    Follow up plan: Return in about 4 months (around 02/21/2018) for HTN .  Carles Collet, PA-C [facility] 10/22/2017, 4:57 PM

## 2017-10-22 NOTE — Patient Instructions (Signed)

## 2017-10-23 LAB — COMPREHENSIVE METABOLIC PANEL
ALT: 20 IU/L (ref 0–32)
AST: 17 IU/L (ref 0–40)
Albumin/Globulin Ratio: 1.7 (ref 1.2–2.2)
Albumin: 4.3 g/dL (ref 3.5–5.5)
Alkaline Phosphatase: 78 IU/L (ref 39–117)
BUN/Creatinine Ratio: 29 — ABNORMAL HIGH (ref 9–23)
BUN: 23 mg/dL (ref 6–24)
Bilirubin Total: <0.2 mg/dL (ref 0.0–1.2)
CO2: 25 mmol/L (ref 20–29)
Calcium: 9.8 mg/dL (ref 8.7–10.2)
Chloride: 98 mmol/L (ref 96–106)
Creatinine, Ser: 0.79 mg/dL (ref 0.57–1.00)
GFR calc Af Amer: 102 mL/min/{1.73_m2} (ref >59)
GFR calc non Af Amer: 88 mL/min/{1.73_m2} (ref >59)
Globulin, Total: 2.6 g/dL (ref 1.5–4.5)
Glucose: 150 mg/dL — ABNORMAL HIGH (ref 65–99)
Potassium: 3.9 mmol/L (ref 3.5–5.2)
Sodium: 140 mmol/L (ref 134–144)
Total Protein: 6.9 g/dL (ref 6.0–8.5)

## 2017-12-07 ENCOUNTER — Ambulatory Visit (INDEPENDENT_AMBULATORY_CARE_PROVIDER_SITE_OTHER): Payer: Managed Care, Other (non HMO) | Admitting: Obstetrics and Gynecology

## 2017-12-07 ENCOUNTER — Encounter: Payer: Self-pay | Admitting: Obstetrics and Gynecology

## 2017-12-07 VITALS — BP 124/80 | HR 84 | Ht 72.0 in | Wt 256.0 lb

## 2017-12-07 DIAGNOSIS — Z1239 Encounter for other screening for malignant neoplasm of breast: Secondary | ICD-10-CM

## 2017-12-07 DIAGNOSIS — Z01419 Encounter for gynecological examination (general) (routine) without abnormal findings: Secondary | ICD-10-CM | POA: Diagnosis not present

## 2017-12-07 NOTE — Progress Notes (Signed)
Gynecology Annual Exam  PCP: Kathrine Haddock, NP  Chief Complaint:  Chief Complaint  Patient presents with  . Gynecologic Exam    History of Present Illness:Patient is a 52 y.o. G1P0010 presents for annual exam. The patient has no complaints today.   LMP: No LMP recorded. (Menstrual status: Perimenopausal). No menses since May 2019    The patient does perform self breast exams.  There is no notable family history of breast or ovarian cancer in her family.  The patient wears seatbelts: yes.   The patient has regular exercise: not asked.    The patient denies current symptoms of depression.     Review of Systems: ROS  Past Medical History:  Past Medical History:  Diagnosis Date  . IFG (impaired fasting glucose)   . Medical history non-contributory   . Menorrhagia     Past Surgical History:  Past Surgical History:  Procedure Laterality Date  . COLONOSCOPY WITH PROPOFOL N/A 05/14/2015   Procedure: COLONOSCOPY WITH PROPOFOL;  Surgeon: Lucilla Lame, MD;  Location: [place];  Service: Endoscopy;  Laterality: N/A;  . CYSTOSCOPY  07/21/2015   Procedure: CYSTOSCOPY;  Surgeon: Malachy Mood, MD;  Location: [facility];  Service: Gynecology;;  . DILATION AND CURETTAGE OF UTERUS  04/2008   Hysteroscopy; endometrial polyps  . LAPAROSCOPIC SALPINGO OOPHERECTOMY Bilateral 07/21/2015   Procedure: LAPAROSCOPIC LEFT SALPINGO OOPHORECTOMY, RIGHT SALPINGECTOMY;  Surgeon: Malachy Mood, MD;  Location: [facility];  Service: Gynecology;  Laterality: Bilateral;  . MOUTH SURGERY      Gynecologic History:  No LMP recorded. (Menstrual status: Perimenopausal). Last Pap: Results were: 10/09/2016 NIL and HR HPV negative  Last mammogram: 10/04/17 Results were: BI-RAD I  Obstetric History: G1P0010  Family History:  Family History  Problem Relation Age of Onset  . Endometrial cancer Mother 79  . Kidney cancer Mother 69  . Hypertension Mother   . Breast cancer Paternal Grandmother  44  . Parkinson's disease Father   . Heart disease Maternal Grandmother        CHF  . Arthritis Maternal Grandmother   . Osteoporosis Maternal Grandmother   . Hypertension Maternal Grandfather   . Heart disease Maternal Grandfather   . Cancer Maternal Grandfather 79       bladder and pancreatic  . Ulcers Paternal Grandfather     Social History:  Social History   Socioeconomic History  . Marital status: Single    Spouse name: Not on file  . Number of children: Not on file  . Years of education: Not on file  . Highest education level: Not on file  Occupational History  . Not on file  Social Needs  . Financial resource strain: Not on file  . Food insecurity:    Worry: Not on file    Inability: Not on file  . Transportation needs:    Medical: Not on file    Non-medical: Not on file  Tobacco Use  . Smoking status: Never Smoker  . Smokeless tobacco: Never Used  Substance and Sexual Activity  . Alcohol use: Yes    Alcohol/week: 2.0 standard drinks    Types: 1 Glasses of wine, 1 Cans of beer per week    Comment:  occ  . Drug use: No  . Sexual activity: Never    Birth control/protection: None  Lifestyle  . Physical activity:    Days per week: Not on file    Minutes per session: Not on file  . Stress: Not on  file  Relationships  . Social connections:    Talks on phone: Not on file    Gets together: Not on file    Attends religious service: Not on file    Active member of club or organization: Not on file    Attends meetings of clubs or organizations: Not on file    Relationship status: Not on file  . Intimate partner violence:    Fear of current or ex partner: Not on file    Emotionally abused: Not on file    Physically abused: Not on file    Forced sexual activity: Not on file  Other Topics Concern  . Not on file  Social History Narrative  . Not on file    Allergies:  Allergies  Allergen Reactions  . Benazepril Shortness Of Breath and Other (See Comments)      Dizziness  . Amoxicillin Diarrhea and Nausea Only    Medications: Prior to Admission medications   Medication Sig Start Date End Date Taking? Authorizing Provider  Ascorbic Acid (VITAMIN C PO) Take 1 tablet by mouth daily.    Yes [provider]  hydrochlorothiazide (HYDRODIURIL) 25 MG tablet Take 1 tablet (25 mg total) by mouth daily. 09/27/17  Yes Kathrine Haddock, NP  ibuprofen (ADVIL,MOTRIN) 600 MG tablet Take 1 tablet (600 mg total) by mouth every 6 (six) hours as needed. 07/21/15  Yes Malachy Mood, MD  Multiple Vitamin (MULTI VITAMIN DAILY PO) Take 1 tablet by mouth daily.    Yes [provider]  Probiotic Product (PROBIOTIC PO) Take 1 tablet by mouth daily as needed.   Yes [provider]    Physical Exam Vitals: Blood pressure 124/80, pulse 84, height 6' (1.829 m), weight 256 lb (116.1 kg).  General: NAD HEENT: normocephalic, anicteric Thyroid: no enlargement, no palpable nodules Pulmonary: No increased work of breathing, CTAB Cardiovascular: RRR, distal pulses 2+ Breast: Breast symmetrical, no tenderness, no palpable nodules or masses, no skin or nipple retraction present, no nipple discharge.  No axillary or supraclavicular lymphadenopathy. Abdomen: NABS, soft, non-tender, non-distended.  Umbilicus without lesions.  No hepatomegaly, splenomegaly or masses palpable. No evidence of hernia  Genitourinary:  External: Normal external female genitalia.  Normal urethral meatus, normal Bartholin's and Skene's glands.    Vagina: Normal vaginal mucosa, no evidence of prolapse.    Cervix: Grossly normal in appearance, no bleeding  Uterus: Non-enlarged, mobile, normal contour.  No CMT  Adnexa: ovaries non-enlarged, no adnexal masses  Rectal: deferred  Lymphatic: no evidence of inguinal lymphadenopathy Extremities: no edema, erythema, or tenderness Neurologic: Grossly intact Psychiatric: mood appropriate, affect full  Female chaperone present for pelvic  and breast  portions of the physical exam     Assessment: 52 y.o. G1P0010 routine annual exam  Plan: Problem List Items Addressed This Visit    None    Visit Diagnoses    Encounter for gynecological examination without abnormal finding    -  Primary   Breast screening          1) Mammogram - recommend yearly screening mammogram.  Mammogram Is up to date  2) STI screening  was notoffered and therefore not obtained  3) ASCCP guidelines and rational discussed.  Patient opts for every 3 years screening interval  4) Osteoporosis  - per USPTF routine screening DEXA at age 53 - FRAX 64 year major fracture risk 5.7%,  10 year hip fracture risk 0.2% - paternal history hip fracture  Consider FDA-approved medical therapies in postmenopausal  women and men aged 64 years and older, based on the following: a) A hip or vertebral (clinical or morphometric) fracture b) T-score ? -2.5 at the femoral neck or spine after appropriate evaluation to exclude secondary causes C) Low bone mass (T-score between -1.0 and -2.5 at the femoral neck or spine) and a 10-year probability of a hip fracture ? 3% or a 10-year probability of a major osteoporosis-related fracture ? 20% based on the [place]-adapted WHO algorithm   5) Routine healthcare maintenance including cholesterol, diabetes screening discussed managed by PCP - ASCVD calculator 10 year risk of atherosclerotic heart disease 1.8% (low risk) with optimal risk 0.8%  6) Colonoscopy - UTD 2018  7) Return in about 1 year (around 12/08/2018) for annual.    Malachy Mood, MD [facility] Pigeon, [facility] 12/07/2017, 3:52 PM

## 2018-02-21 ENCOUNTER — Other Ambulatory Visit: Payer: Self-pay

## 2018-02-21 ENCOUNTER — Encounter: Payer: Self-pay | Admitting: Nurse Practitioner

## 2018-02-21 ENCOUNTER — Ambulatory Visit (INDEPENDENT_AMBULATORY_CARE_PROVIDER_SITE_OTHER): Payer: Managed Care, Other (non HMO) | Admitting: Nurse Practitioner

## 2018-02-21 VITALS — BP 134/83 | HR 93 | Temp 98.2°F | Ht 72.0 in | Wt 250.0 lb

## 2018-02-21 DIAGNOSIS — I10 Essential (primary) hypertension: Secondary | ICD-10-CM

## 2018-02-21 DIAGNOSIS — E669 Obesity, unspecified: Secondary | ICD-10-CM | POA: Insufficient documentation

## 2018-02-21 DIAGNOSIS — Z6835 Body mass index (BMI) 35.0-35.9, adult: Secondary | ICD-10-CM | POA: Insufficient documentation

## 2018-02-21 NOTE — Progress Notes (Signed)
BP 134/83   Pulse 93   Temp 98.2 F (36.8 C) (Oral)   Ht 6' (1.829 m)   Wt 250 lb (113.4 kg)   SpO2 95%   BMI 33.91 kg/m    Subjective:    Patient ID: Brooke Henson, female    DOB: 03-21-1965, 53 y.o.   MRN: [id number]  HPI: Brooke Henson is a 53 y.o. female presents for HTN follow-up  Chief Complaint  Patient presents with  . Hypertension    f/u   HYPERTENSION At this time she remains on HCTZ without ADR.  Is working out with a Physiological scientist. Hypertension status: controlled  Satisfied with current treatment? yes Duration of hypertension: chronic BP monitoring frequency:  rarely BP range: 120-130/60-70 BP medication side effects:  no Medication compliance: good compliance Aspirin: no Recurrent headaches: no Visual changes: no Palpitations: no Dyspnea: no Chest pain: no Lower extremity edema: no Dizzy/lightheaded: no  Relevant past medical, surgical, family and social history reviewed and updated as indicated. Interim medical history since our last visit reviewed. Allergies and medications reviewed and updated.  Review of Systems  Constitutional: Negative for activity change, appetite change, diaphoresis, fatigue and fever.  Respiratory: Negative for cough, chest tightness and shortness of breath.   Cardiovascular: Negative for chest pain, palpitations and leg swelling.  Gastrointestinal: Negative for abdominal distention, abdominal pain, constipation, diarrhea, nausea and vomiting.  Endocrine: Negative for cold intolerance, heat intolerance, polydipsia, polyphagia and polyuria.  Neurological: Negative for dizziness, syncope, weakness, light-headedness, numbness and headaches.  Psychiatric/Behavioral: Negative.     Per HPI unless specifically indicated above     Objective:    BP 134/83   Pulse 93   Temp 98.2 F (36.8 C) (Oral)   Ht 6' (1.829 m)   Wt 250 lb (113.4 kg)   SpO2 95%   BMI 33.91 kg/m   Wt Readings from Last 3 Encounters:  02/21/18  250 lb (113.4 kg)  12/07/17 256 lb (116.1 kg)  10/22/17 251 lb (113.9 kg)    Physical Exam Vitals signs and nursing note reviewed.  Constitutional:      General: She is awake.     Appearance: She is well-developed.  HENT:     Head: Normocephalic.     Right Ear: Hearing normal.     Left Ear: Hearing normal.     Nose: Nose normal.     Mouth/Throat:     Mouth: Mucous membranes are moist.  Eyes:     General: Lids are normal.        Right eye: No discharge.        Left eye: No discharge.     Conjunctiva/sclera: Conjunctivae normal.     Pupils: Pupils are equal, round, and reactive to light.  Neck:     Musculoskeletal: Normal range of motion and neck supple.     Thyroid: No thyromegaly.     Vascular: No carotid bruit or JVD.  Cardiovascular:     Rate and Rhythm: Normal rate and regular rhythm.     Heart sounds: Normal heart sounds. No murmur. No gallop.   Pulmonary:     Effort: Pulmonary effort is normal.     Breath sounds: Normal breath sounds.  Abdominal:     General: Bowel sounds are normal.     Palpations: Abdomen is soft. There is no hepatomegaly or splenomegaly.  Musculoskeletal:     Right lower leg: No edema.     Left lower leg: No edema.  Lymphadenopathy:  Cervical: No cervical adenopathy.  Skin:    General: Skin is warm and dry.  Neurological:     Mental Status: She is alert and oriented to person, place, and time.  Psychiatric:        Attention and Perception: Attention normal.        Mood and Affect: Mood normal.        Behavior: Behavior normal. Behavior is cooperative.        Thought Content: Thought content normal.        Judgment: Judgment normal.     Results for orders placed or performed in visit on 10/22/17  Comp Met (CMET)  Result Value Ref Range   Glucose 150 (H) 65 - 99 mg/dL   BUN 23 6 - 24 mg/dL   Creatinine, Ser 0.79 0.57 - 1.00 mg/dL   GFR calc non Af Amer 88 >59 mL/min/1.73   GFR calc Af Amer 102 >59 mL/min/1.73   BUN/Creatinine  Ratio 29 (H) 9 - 23   Sodium 140 134 - 144 mmol/L   Potassium 3.9 3.5 - 5.2 mmol/L   Chloride 98 96 - 106 mmol/L   CO2 25 20 - 29 mmol/L   Calcium 9.8 8.7 - 10.2 mg/dL   Total Protein 6.9 6.0 - 8.5 g/dL   Albumin 4.3 3.5 - 5.5 g/dL   Globulin, Total 2.6 1.5 - 4.5 g/dL   Albumin/Globulin Ratio 1.7 1.2 - 2.2   Bilirubin Total <0.2 0.0 - 1.2 mg/dL   Alkaline Phosphatase 78 39 - 117 IU/L   AST 17 0 - 40 IU/L   ALT 20 0 - 32 IU/L      Assessment & Plan:   Problem List Items Addressed This Visit      Cardiovascular and Mediastinum   Hypertension - Primary    Chronic, ongoing.  BP at goal today and below goal at home.  Continue current medication regimen.  Will obtain fasting labs next visit.        Other   Obesity (BMI 30.0-34.9)    Recommend continue focus on healthy diet and regular exercise.  She is currently using a Physiological scientist.          Follow up plan: Return in about 6 months (around 08/22/2018) for HTN.

## 2018-02-21 NOTE — Assessment & Plan Note (Signed)
Recommend continue focus on healthy diet and regular exercise.  She is currently using a Physiological scientist.

## 2018-02-21 NOTE — Assessment & Plan Note (Addendum)
Chronic, ongoing.  BP at goal today and below goal at home.  Continue current medication regimen.  Will obtain fasting labs next visit.

## 2018-02-21 NOTE — Patient Instructions (Signed)
DASH Eating Plan  DASH stands for "Dietary Approaches to Stop Hypertension." The DASH eating plan is a healthy eating plan that has been shown to reduce high blood pressure (hypertension). It may also reduce your risk for type 2 diabetes, heart disease, and stroke. The DASH eating plan may also help with weight loss.  What are tips for following this plan?    General guidelines   Avoid eating more than 2,300 mg (milligrams) of salt (sodium) a day. If you have hypertension, you may need to reduce your sodium intake to 1,500 mg a day.   Limit alcohol intake to no more than 1 drink a day for nonpregnant women and 2 drinks a day for men. One drink equals 12 oz of beer, 5 oz of wine, or 1 oz of hard liquor.   Work with your health care provider to maintain a healthy body weight or to lose weight. Ask what an ideal weight is for you.   Get at least 30 minutes of exercise that causes your heart to beat faster (aerobic exercise) most days of the week. Activities may include walking, swimming, or biking.   Work with your health care provider or diet and nutrition specialist (dietitian) to adjust your eating plan to your individual calorie needs.  Reading food labels     Check food labels for the amount of sodium per serving. Choose foods with less than 5 percent of the Daily Value of sodium. Generally, foods with less than 300 mg of sodium per serving fit into this eating plan.   To find whole grains, look for the word "whole" as the first word in the ingredient list.  Shopping   Buy products labeled as "low-sodium" or "no salt added."   Buy fresh foods. Avoid canned foods and premade or frozen meals.  Cooking   Avoid adding salt when cooking. Use salt-free seasonings or herbs instead of table salt or sea salt. Check with your health care provider or pharmacist before using salt substitutes.   Do not fry foods. Cook foods using healthy methods such as baking, boiling, grilling, and broiling instead.   Cook with  heart-healthy oils, such as olive, canola, soybean, or sunflower oil.  Meal planning   Eat a balanced diet that includes:  ? 5 or more servings of fruits and vegetables each day. At each meal, try to fill half of your plate with fruits and vegetables.  ? Up to 6-8 servings of whole grains each day.  ? Less than 6 oz of lean meat, poultry, or fish each day. A 3-oz serving of meat is about the same size as a deck of cards. One egg equals 1 oz.  ? 2 servings of low-fat dairy each day.  ? A serving of nuts, seeds, or beans 5 times each week.  ? Heart-healthy fats. Healthy fats called Omega-3 fatty acids are found in foods such as flaxseeds and coldwater fish, like sardines, salmon, and mackerel.   Limit how much you eat of the following:  ? Canned or prepackaged foods.  ? Food that is high in trans fat, such as fried foods.  ? Food that is high in saturated fat, such as fatty meat.  ? Sweets, desserts, sugary drinks, and other foods with added sugar.  ? Full-fat dairy products.   Do not salt foods before eating.   Try to eat at least 2 vegetarian meals each week.   Eat more home-cooked food and less restaurant, buffet, and fast food.     When eating at a restaurant, ask that your food be prepared with less salt or no salt, if possible.  What foods are recommended?  The items listed may not be a complete list. Talk with your dietitian about what dietary choices are best for you.  Grains  Whole-grain or whole-wheat bread. Whole-grain or whole-wheat pasta. Brown rice. Oatmeal. Quinoa. Bulgur. Whole-grain and low-sodium cereals. Pita bread. Low-fat, low-sodium crackers. Whole-wheat flour tortillas.  Vegetables  Fresh or frozen vegetables (raw, steamed, roasted, or grilled). Low-sodium or reduced-sodium tomato and vegetable juice. Low-sodium or reduced-sodium tomato sauce and tomato paste. Low-sodium or reduced-sodium canned vegetables.  Fruits  All fresh, dried, or frozen fruit. Canned fruit in natural juice (without  added sugar).  Meat and other protein foods  Skinless chicken or turkey. Ground chicken or turkey. Pork with fat trimmed off. Fish and seafood. Egg whites. Dried beans, peas, or lentils. Unsalted nuts, nut butters, and seeds. Unsalted canned beans. Lean cuts of beef with fat trimmed off. Low-sodium, lean deli meat.  Dairy  Low-fat (1%) or fat-free (skim) milk. Fat-free, low-fat, or reduced-fat cheeses. Nonfat, low-sodium ricotta or cottage cheese. Low-fat or nonfat yogurt. Low-fat, low-sodium cheese.  Fats and oils  Soft margarine without trans fats. Vegetable oil. Low-fat, reduced-fat, or light mayonnaise and salad dressings (reduced-sodium). Canola, safflower, olive, soybean, and sunflower oils. Avocado.  Seasoning and other foods  Herbs. Spices. Seasoning mixes without salt. Unsalted popcorn and pretzels. Fat-free sweets.  What foods are not recommended?  The items listed may not be a complete list. Talk with your dietitian about what dietary choices are best for you.  Grains  Baked goods made with fat, such as croissants, muffins, or some breads. Dry pasta or rice meal packs.  Vegetables  Creamed or fried vegetables. Vegetables in a cheese sauce. Regular canned vegetables (not low-sodium or reduced-sodium). Regular canned tomato sauce and paste (not low-sodium or reduced-sodium). Regular tomato and vegetable juice (not low-sodium or reduced-sodium). Pickles. Olives.  Fruits  Canned fruit in a light or heavy syrup. Fried fruit. Fruit in cream or butter sauce.  Meat and other protein foods  Fatty cuts of meat. Ribs. Fried meat. Bacon. Sausage. Bologna and other processed lunch meats. Salami. Fatback. Hotdogs. Bratwurst. Salted nuts and seeds. Canned beans with added salt. Canned or smoked fish. Whole eggs or egg yolks. Chicken or turkey with skin.  Dairy  Whole or 2% milk, cream, and half-and-half. Whole or full-fat cream cheese. Whole-fat or sweetened yogurt. Full-fat cheese. Nondairy creamers. Whipped toppings.  Processed cheese and cheese spreads.  Fats and oils  Butter. Stick margarine. Lard. Shortening. Ghee. Bacon fat. Tropical oils, such as coconut, palm kernel, or palm oil.  Seasoning and other foods  Salted popcorn and pretzels. Onion salt, garlic salt, seasoned salt, table salt, and sea salt. Worcestershire sauce. Tartar sauce. Barbecue sauce. Teriyaki sauce. Soy sauce, including reduced-sodium. Steak sauce. Canned and packaged gravies. Fish sauce. Oyster sauce. Cocktail sauce. Horseradish that you find on the shelf. Ketchup. Mustard. Meat flavorings and tenderizers. Bouillon cubes. Hot sauce and Tabasco sauce. Premade or packaged marinades. Premade or packaged taco seasonings. Relishes. Regular salad dressings.  Where to find more information:   National Heart, Lung, and Blood Institute: www.nhlbi.nih.gov   American Heart Association: www.heart.org  Summary   The DASH eating plan is a healthy eating plan that has been shown to reduce high blood pressure (hypertension). It may also reduce your risk for type 2 diabetes, heart disease, and stroke.   With the   DASH eating plan, you should limit salt (sodium) intake to 2,300 mg a day. If you have hypertension, you may need to reduce your sodium intake to 1,500 mg a day.   When on the DASH eating plan, aim to eat more fresh fruits and vegetables, whole grains, lean proteins, low-fat dairy, and heart-healthy fats.   Work with your health care provider or diet and nutrition specialist (dietitian) to adjust your eating plan to your individual calorie needs.  This information is not intended to replace advice given to you by your health care provider. Make sure you discuss any questions you have with your health care provider.  Document Released: 12/29/2010 Document Revised: 01/03/2016 Document Reviewed: 01/03/2016  [employer]vier Interactive Patient Education  2019 [employer]

## 2018-08-15 ENCOUNTER — Other Ambulatory Visit: Payer: Self-pay | Admitting: Unknown Physician Specialty

## 2018-08-23 ENCOUNTER — Ambulatory Visit (INDEPENDENT_AMBULATORY_CARE_PROVIDER_SITE_OTHER): Payer: Managed Care, Other (non HMO) | Admitting: Nurse Practitioner

## 2018-08-23 ENCOUNTER — Other Ambulatory Visit: Payer: Self-pay

## 2018-08-23 ENCOUNTER — Encounter: Payer: Self-pay | Admitting: Nurse Practitioner

## 2018-08-23 DIAGNOSIS — I10 Essential (primary) hypertension: Secondary | ICD-10-CM | POA: Diagnosis not present

## 2018-08-23 NOTE — Assessment & Plan Note (Signed)
Chronic, ongoing with recent elevations due to stressors.  Continue current medication regimen at this time and focus on diet and current exercise regimen.  Will follow-up in 3 months and if continued elevations consider addition of Amlodipine.

## 2018-08-23 NOTE — Progress Notes (Signed)
BP (!) 144/94 Comment: pt reported  Pulse 91 Comment: pt reported   Subjective:    Patient ID: Brooke Henson, female    DOB: 1965/11/15, 53 y.o.   MRN: [id number]  HPI: Brooke Henson is a 53 y.o. female  Chief Complaint  Patient presents with  . Hypertension    . This visit was completed via WebEx due to the restrictions of the COVID-19 pandemic. All issues as above were discussed and addressed. Physical exam was done as above through visual confirmation on WebEx. If it was felt that the patient should be evaluated in the office, they were directed there. The patient verbally consented to this visit. . Location of the patient: home . Location of the provider: home . Those involved with this call:  . Provider: Marnee Guarneri, DNP . CMA: Yvonna Alanis, CMA . Front Desk/Registration: Jill Side  . Time spent on call: 15 minutes with patient face to face via video conference. More than 50% of this time was spent in counseling and coordination of care. 10 minutes total spent in review of patient's record and preparation of their chart.  . I verified patient identity using two factors (patient name and date of birth). Patient consents verbally to being seen via telemedicine visit today.    HYPERTENSION Continues on HCTZ 25 MG daily without ADR.  Has been working from home since March 21st.   Hypertension status: exacerbated  Satisfied with current treatment? yes Duration of hypertension: chronic BP monitoring frequency:  rarely BP range: just started monitoring recently, slightly elevated on recent checks due to recent stressors with working at home.  Has started working out with trainer again.  Has been working out more recently, walking more. BP medication side effects:  no Medication compliance: good compliance Aspirin: no Recurrent headaches: no Visual changes: no Palpitations: no Dyspnea: no Chest pain: no Lower extremity edema: no Dizzy/lightheaded: no  Relevant  past medical, surgical, family and social history reviewed and updated as indicated. Interim medical history since our last visit reviewed. Allergies and medications reviewed and updated.  Review of Systems  Constitutional: Negative for activity change, appetite change, diaphoresis, fatigue and fever.  Respiratory: Negative for cough, chest tightness and shortness of breath.   Cardiovascular: Negative for chest pain, palpitations and leg swelling.  Gastrointestinal: Negative for abdominal distention, abdominal pain, constipation, diarrhea, nausea and vomiting.  Endocrine: Negative for cold intolerance, heat intolerance, polydipsia, polyphagia and polyuria.  Neurological: Negative for dizziness, syncope, weakness, light-headedness, numbness and headaches.  Psychiatric/Behavioral: Negative.     Per HPI unless specifically indicated above     Objective:    BP (!) 144/94 Comment: pt reported  Pulse 91 Comment: pt reported  Wt Readings from Last 3 Encounters:  02/21/18 250 lb (113.4 kg)  12/07/17 256 lb (116.1 kg)  10/22/17 251 lb (113.9 kg)    Physical Exam Vitals signs and nursing note reviewed.  Constitutional:      General: She is awake. She is not in acute distress.    Appearance: She is well-developed. She is not ill-appearing.  HENT:     Head: Normocephalic.     Right Ear: Hearing normal.     Left Ear: Hearing normal.  Eyes:     General: Lids are normal.        Right eye: No discharge.        Left eye: No discharge.     Conjunctiva/sclera: Conjunctivae normal.  Neck:     Musculoskeletal: Normal range of motion.  Cardiovascular:     Comments: Unable to auscultate due to virtual exam only  Pulmonary:     Effort: Pulmonary effort is normal. No accessory muscle usage or respiratory distress.     Comments: Unable to auscultate due to virtual exam only  Neurological:     Mental Status: She is alert and oriented to person, place, and time.  Psychiatric:        Attention  and Perception: Attention normal.        Mood and Affect: Mood normal.        Behavior: Behavior normal. Behavior is cooperative.        Thought Content: Thought content normal.        Judgment: Judgment normal.     Results for orders placed or performed in visit on 10/22/17  Comp Met (CMET)  Result Value Ref Range   Glucose 150 (H) 65 - 99 mg/dL   BUN 23 6 - 24 mg/dL   Creatinine, Ser 0.79 0.57 - 1.00 mg/dL   GFR calc non Af Amer 88 >59 mL/min/1.73   GFR calc Af Amer 102 >59 mL/min/1.73   BUN/Creatinine Ratio 29 (H) 9 - 23   Sodium 140 134 - 144 mmol/L   Potassium 3.9 3.5 - 5.2 mmol/L   Chloride 98 96 - 106 mmol/L   CO2 25 20 - 29 mmol/L   Calcium 9.8 8.7 - 10.2 mg/dL   Total Protein 6.9 6.0 - 8.5 g/dL   Albumin 4.3 3.5 - 5.5 g/dL   Globulin, Total 2.6 1.5 - 4.5 g/dL   Albumin/Globulin Ratio 1.7 1.2 - 2.2   Bilirubin Total <0.2 0.0 - 1.2 mg/dL   Alkaline Phosphatase 78 39 - 117 IU/L   AST 17 0 - 40 IU/L   ALT 20 0 - 32 IU/L      Assessment & Plan:   Problem List Items Addressed This Visit      Cardiovascular and Mediastinum   Hypertension    Chronic, ongoing with recent elevations due to stressors.  Continue current medication regimen at this time and focus on diet and current exercise regimen.  Will follow-up in 3 months and if continued elevations consider addition of Amlodipine.           I discussed the assessment and treatment plan with the patient. The patient was provided an opportunity to ask questions and all were answered. The patient agreed with the plan and demonstrated an understanding of the instructions.   The patient was advised to call back or seek an in-person evaluation if the symptoms worsen or if the condition fails to improve as anticipated.   I provided 15 minutes of time during this encounter.  Follow up plan: Return in about 3 months (around 11/23/2018) for HTN and IFG.

## 2018-08-23 NOTE — Patient Instructions (Signed)

## 2018-11-06 ENCOUNTER — Other Ambulatory Visit: Payer: Self-pay | Admitting: Nurse Practitioner

## 2018-11-06 DIAGNOSIS — Z1231 Encounter for screening mammogram for malignant neoplasm of breast: Secondary | ICD-10-CM

## 2018-11-11 ENCOUNTER — Other Ambulatory Visit: Payer: Self-pay

## 2018-11-11 MED ORDER — HYDROCHLOROTHIAZIDE 25 MG PO TABS: ORAL_TABLET | ORAL | 0 refills | Status: DC

## 2018-11-11 NOTE — Telephone Encounter (Signed)
Patient last seen 08/23/18 and has appointment 11/26/18

## 2018-11-26 ENCOUNTER — Other Ambulatory Visit: Payer: Self-pay

## 2018-11-26 ENCOUNTER — Ambulatory Visit (INDEPENDENT_AMBULATORY_CARE_PROVIDER_SITE_OTHER): Payer: Managed Care, Other (non HMO) | Admitting: Nurse Practitioner

## 2018-11-26 ENCOUNTER — Encounter: Payer: Self-pay | Admitting: Nurse Practitioner

## 2018-11-26 VITALS — BP 141/96 | HR 94

## 2018-11-26 DIAGNOSIS — Z808 Family history of malignant neoplasm of other organs or systems: Secondary | ICD-10-CM

## 2018-11-26 DIAGNOSIS — I10 Essential (primary) hypertension: Secondary | ICD-10-CM

## 2018-11-26 NOTE — Patient Instructions (Signed)

## 2018-11-26 NOTE — Assessment & Plan Note (Signed)
Chronic, ongoing with occasional elevations due to stressors.  Continue current medication regimen at this time and focus on diet and current exercise regimen.  Will follow-up in 6 months and if continued elevations consider addition of Amlodipine.  She will contact provider via My Chart with updated Excel sheet and if any consistent elevations noted.

## 2018-11-26 NOTE — Progress Notes (Signed)
BP (!) 141/96 Comment: pt reported  Pulse 94    Subjective:    Patient ID: Brooke Henson, female    DOB: [birth year], 53 y.o.   MRN: [id number]  HPI: Brooke Henson is a 53 y.o. female  Chief Complaint  Patient presents with  . Hypertension    . This visit was completed via FaceTime due to the restrictions of the COVID-19 pandemic. All issues as above were discussed and addressed. Physical exam was done as above through visual confirmation on FaceTime. If it was felt that the patient should be evaluated in the office, they were directed there. The patient verbally consented to this visit. . Location of the patient: home . Location of the provider: home . Those involved with this call:  . Provider: Marnee Guarneri, DNP . CMA: Yvonna Alanis, CMA . Front Desk/Registration: Jill Side  . Time spent on call: 15 minutes with patient face to face via video conference. More than 50% of this time was spent in counseling and coordination of care. 10 minutes total spent in review of patient's record and preparation of their chart.  . I verified patient identity using two factors (patient name and date of birth). Patient consents verbally to being seen via telemedicine visit today.    HYPERTENSION Continues on HCTZ 25 MG daily.  Has been working from home since March 21st and endorses changes in activity and diet.  On Friday October 22nd her mother had a melanoma removed and she reports having panic attack from all the situation + work stressors, this was one elevation in BP she has had 170/100 range.  This improved and is only time she has had elevation.  Had no symptoms with it at that time.  She is going to take BP cuff to [facility] for recalibrate, as feels cuff is not working correctly.   Hypertension status: stable  Satisfied with current treatment? yes Duration of hypertension: chronic BP monitoring frequency:  daily BP range: 120-130/80's average --- average HR 91 -- review Excel  chart patient uses BP medication side effects:  no Medication compliance: good compliance Aspirin: no Recurrent headaches: no Visual changes: no Palpitations: no Dyspnea: no Chest pain: no Lower extremity edema: no Dizzy/lightheaded: no   DERMATOLOGY REFERRAL: She would like a referral to dermatology for a full skin assessment due to her mother recent removal of melanoma and patient is outside a lot.  She denies any current suspicious lesions.    Relevant past medical, surgical, family and social history reviewed and updated as indicated. Interim medical history since our last visit reviewed. Allergies and medications reviewed and updated.  Review of Systems  Constitutional: Negative for activity change, appetite change, diaphoresis, fatigue and fever.  Respiratory: Negative for cough, chest tightness and shortness of breath.   Cardiovascular: Negative for chest pain, palpitations and leg swelling.  Gastrointestinal: Negative for abdominal distention, abdominal pain, constipation, diarrhea, nausea and vomiting.  Neurological: Negative for dizziness, syncope, weakness, light-headedness, numbness and headaches.  Psychiatric/Behavioral: Negative.     Per HPI unless specifically indicated above     Objective:    BP (!) 141/96 Comment: pt reported  Pulse 94   Wt Readings from Last 3 Encounters:  02/21/18 250 lb (113.4 kg)  12/07/17 256 lb (116.1 kg)  10/22/17 251 lb (113.9 kg)    Physical Exam Vitals signs and nursing note reviewed.  Constitutional:      General: She is awake. She is not in acute distress.    Appearance: She  is well-developed. She is not ill-appearing.  HENT:     Head: Normocephalic.     Right Ear: Hearing normal.     Left Ear: Hearing normal.  Eyes:     General: Lids are normal.        Right eye: No discharge.        Left eye: No discharge.     Conjunctiva/sclera: Conjunctivae normal.  Neck:     Musculoskeletal: Normal range of motion.  Pulmonary:      Effort: Pulmonary effort is normal. No accessory muscle usage or respiratory distress.  Neurological:     Mental Status: She is alert and oriented to person, place, and time.  Psychiatric:        Attention and Perception: Attention normal.        Mood and Affect: Mood normal.        Behavior: Behavior normal. Behavior is cooperative.        Thought Content: Thought content normal.        Judgment: Judgment normal.    Results for orders placed or performed in visit on 10/22/17  Comp Met (CMET)  Result Value Ref Range   Glucose 150 (H) 65 - 99 mg/dL   BUN 23 6 - 24 mg/dL   Creatinine, Ser 0.79 0.57 - 1.00 mg/dL   GFR calc non Af Amer 88 >59 mL/min/1.73   GFR calc Af Amer 102 >59 mL/min/1.73   BUN/Creatinine Ratio 29 (H) 9 - 23   Sodium 140 134 - 144 mmol/L   Potassium 3.9 3.5 - 5.2 mmol/L   Chloride 98 96 - 106 mmol/L   CO2 25 20 - 29 mmol/L   Calcium 9.8 8.7 - 10.2 mg/dL   Total Protein 6.9 6.0 - 8.5 g/dL   Albumin 4.3 3.5 - 5.5 g/dL   Globulin, Total 2.6 1.5 - 4.5 g/dL   Albumin/Globulin Ratio 1.7 1.2 - 2.2   Bilirubin Total <0.2 0.0 - 1.2 mg/dL   Alkaline Phosphatase 78 39 - 117 IU/L   AST 17 0 - 40 IU/L   ALT 20 0 - 32 IU/L      Assessment & Plan:   Problem List Items Addressed This Visit      Cardiovascular and Mediastinum   Hypertension - Primary    Chronic, ongoing with occasional elevations due to stressors.  Continue current medication regimen at this time and focus on diet and current exercise regimen.  Will follow-up in 6 months and if continued elevations consider addition of Amlodipine.  She will contact provider via My Chart with updated Excel sheet and if any consistent elevations noted.       Other Visit Diagnoses    Family history of melanoma       Referral to dermatology per patient request   Relevant Orders   Ambulatory referral to Dermatology      I discussed the assessment and treatment plan with the patient. The patient was provided an  opportunity to ask questions and all were answered. The patient agreed with the plan and demonstrated an understanding of the instructions.   The patient was advised to call back or seek an in-person evaluation if the symptoms worsen or if the condition fails to improve as anticipated.   I provided 15 minutes of time during this encounter.  Follow up plan: Return in about 6 months (around 05/26/2019) for HTN and IFG.

## 2018-12-27 ENCOUNTER — Other Ambulatory Visit: Payer: Self-pay

## 2018-12-27 ENCOUNTER — Encounter: Payer: Self-pay | Admitting: Obstetrics and Gynecology

## 2018-12-27 ENCOUNTER — Ambulatory Visit (INDEPENDENT_AMBULATORY_CARE_PROVIDER_SITE_OTHER): Payer: Managed Care, Other (non HMO) | Admitting: Obstetrics and Gynecology

## 2018-12-27 VITALS — BP 124/74 | HR 54 | Ht 72.0 in | Wt 244.0 lb

## 2018-12-27 DIAGNOSIS — Z1239 Encounter for other screening for malignant neoplasm of breast: Secondary | ICD-10-CM

## 2018-12-27 DIAGNOSIS — N950 Postmenopausal bleeding: Secondary | ICD-10-CM

## 2018-12-27 DIAGNOSIS — Z01419 Encounter for gynecological examination (general) (routine) without abnormal findings: Secondary | ICD-10-CM | POA: Diagnosis not present

## 2018-12-27 NOTE — Progress Notes (Signed)
Gynecology Annual Exam  PCP: Venita Lick, NP  Chief Complaint:  Chief Complaint  Patient presents with  . Gynecologic Exam    History of Present Illness:Patient is a 53 y.o. G1P0010 presents for annual exam. The patient has no complaints today.   LMP: No LMP recorded. (Menstrual status: Perimenopausal).  The patient does perform self breast exams.  There is no notable family history of breast or ovarian cancer in her family.  The patient wears seatbelts: yes.   The patient has regular exercise: not asked.    The patient denies current symptoms of depression.     Review of Systems: Review of Systems  Constitutional: Negative for chills and fever.  HENT: Negative for congestion.   Respiratory: Negative for cough and shortness of breath.   Cardiovascular: Negative for chest pain and palpitations.  Gastrointestinal: Negative for abdominal pain, constipation, diarrhea, heartburn, nausea and vomiting.  Genitourinary: Negative for dysuria, frequency and urgency.  Skin: Negative for itching and rash.  Neurological: Negative for dizziness and headaches.  Endo/Heme/Allergies: Negative for polydipsia.  Psychiatric/Behavioral: Negative for depression.    Past Medical History:  Past Medical History:  Diagnosis Date  . IFG (impaired fasting glucose)   . Medical history non-contributory   . Menorrhagia     Past Surgical History:  Past Surgical History:  Procedure Laterality Date  . COLONOSCOPY WITH PROPOFOL N/A 05/14/2015   Procedure: COLONOSCOPY WITH PROPOFOL;  Surgeon: Lucilla Lame, MD;  Location: [facility];  Service: Endoscopy;  Laterality: N/A;  . CYSTOSCOPY  07/21/2015   Procedure: CYSTOSCOPY;  Surgeon: Malachy Mood, MD;  Location: [facility];  Service: Gynecology;;  . DILATION AND CURETTAGE OF UTERUS  04/2008   Hysteroscopy; endometrial polyps  . LAPAROSCOPIC SALPINGO OOPHERECTOMY Bilateral 07/21/2015   Procedure: LAPAROSCOPIC LEFT SALPINGO OOPHORECTOMY,  RIGHT SALPINGECTOMY;  Surgeon: Malachy Mood, MD;  Location: [facility];  Service: Gynecology;  Laterality: Bilateral;  . MOUTH SURGERY      Gynecologic History:  No LMP recorded. (Menstrual status: Perimenopausal). Last Pap: Results were: 10/09/2016 NIL and HR HPV negative  Last mammogram: 10/04/2017 Results were: BI-RAD I  Obstetric History: G1P0010  Family History:  Family History  Problem Relation Age of Onset  . Endometrial cancer Mother 18  . Kidney cancer Mother 23  . Hypertension Mother   . Melanoma Mother   . Breast cancer Paternal Grandmother 66  . Parkinson's disease Father   . Heart disease Maternal Grandmother        CHF  . Arthritis Maternal Grandmother   . Osteoporosis Maternal Grandmother   . Hypertension Maternal Grandfather   . Heart disease Maternal Grandfather   . Cancer Maternal Grandfather 79       bladder and pancreatic  . Ulcers Paternal Grandfather     Social History:  Social History   Socioeconomic History  . Marital status: Single    Spouse name: Not on file  . Number of children: Not on file  . Years of education: Not on file  . Highest education level: Not on file  Occupational History  . Not on file  Social Needs  . Financial resource strain: Not on file  . Food insecurity    Worry: Not on file    Inability: Not on file  . Transportation needs    Medical: Not on file    Non-medical: Not on file  Tobacco Use  . Smoking status: Never Smoker  . Smokeless tobacco: Never Used  Substance and Sexual  Activity  . Alcohol use: Yes    Alcohol/week: 2.0 standard drinks    Types: 1 Glasses of wine, 1 Cans of beer per week    Comment:  occ  . Drug use: No  . Sexual activity: Never    Birth control/protection: None  Lifestyle  . Physical activity    Days per week: Not on file    Minutes per session: Not on file  . Stress: Not on file  Relationships  . Social Herbalist on phone: Not on file    Gets together: Not on file     Attends religious service: Not on file    Active member of club or organization: Not on file    Attends meetings of clubs or organizations: Not on file    Relationship status: Not on file  . Intimate partner violence    Fear of current or ex partner: Not on file    Emotionally abused: Not on file    Physically abused: Not on file    Forced sexual activity: Not on file  Other Topics Concern  . Not on file  Social History Narrative  . Not on file    Allergies:  Allergies  Allergen Reactions  . Benazepril Shortness Of Breath and Other (See Comments)    Dizziness  . Amoxicillin Diarrhea and Nausea Only    Medications: Prior to Admission medications   Medication Sig Start Date End Date Taking? Authorizing Provider  Ascorbic Acid (VITAMIN C PO) Take 1 tablet by mouth daily.    Yes [provider]  hydrochlorothiazide (HYDRODIURIL) 25 MG tablet TAKE 1 TABLET(25 MG) BY MOUTH DAILY 11/11/18  Yes Cannady, Jolene T, NP  Multiple Vitamin (MULTI VITAMIN DAILY PO) Take 1 tablet by mouth daily.    Yes [provider]    Physical Exam Vitals: Blood pressure 124/74, pulse (!) 54, height 6' (1.829 m), weight 244 lb (110.7 kg).  General: NAD HEENT: normocephalic, anicteric Thyroid: no enlargement, no palpable nodules Pulmonary: No increased work of breathing, CTAB Cardiovascular: RRR, distal pulses 2+ Breast: Breast symmetrical, no tenderness, no palpable nodules or masses, no skin or nipple retraction present, no nipple discharge.  No axillary or supraclavicular lymphadenopathy. Abdomen: NABS, soft, non-tender, non-distended.  Umbilicus without lesions.  No hepatomegaly, splenomegaly or masses palpable. No evidence of hernia  Genitourinary:  External: Normal external female genitalia.  Normal urethral meatus, normal Bartholin's and Skene's glands.    Vagina: Normal vaginal mucosa, no evidence of prolapse.    Cervix: Grossly normal in appearance, no bleeding  Uterus:  Non-enlarged, mobile, normal contour.  No CMT  Adnexa: ovaries non-enlarged, no adnexal masses  Rectal: deferred  Lymphatic: no evidence of inguinal lymphadenopathy Extremities: no edema, erythema, or tenderness Neurologic: Grossly intact Psychiatric: mood appropriate, affect full  Female chaperone present for pelvic and breast  portions of the physical exam     Assessment: 53 y.o. G1P0010 routine annual exam  Plan: Problem List Items Addressed This Visit    None    Visit Diagnoses    Encounter for gynecological examination without abnormal finding    -  Primary   Breast screening       Relevant Orders   MM 3D SCREEN BREAST BILATERAL      1) Mammogram - recommend yearly screening mammogram.  Mammogram Was ordered today  2) STI screening  was notoffered and therefore not obtained  3) ASCCP guidelines and rational discussed.  Patient opts for every 3 years  screening interval  4) Osteoporosis  - per USPTF routine screening DEXA at age 45  5) Routine healthcare maintenance including cholesterol, diabetes screening discussed managed by PCP  6) Colonoscopy UTD 05/14/2015  7) Return in about 1 year (around 12/27/2019) for annual.    Malachy Mood, MD Mosetta Pigeon, [place] 12/27/2018, 3:46 PM

## 2018-12-27 NOTE — Patient Instructions (Signed)
[facility] [address] [place] [address]  [facility] [place]  [address] [place] [address]  Phone: [phone]  

## 2019-01-29 ENCOUNTER — Ambulatory Visit
Admission: RE | Admit: 2019-01-29 | Discharge: 2019-01-29 | Disposition: A | Discharge status: Home / Self Care | Payer: Managed Care, Other (non HMO) | Source: Ambulatory Visit | Attending: Nurse Practitioner | Admitting: Nurse Practitioner

## 2019-01-29 DIAGNOSIS — Z1231 Encounter for screening mammogram for malignant neoplasm of breast: Secondary | ICD-10-CM | POA: Diagnosis present

## 2019-01-31 ENCOUNTER — Ambulatory Visit (INDEPENDENT_AMBULATORY_CARE_PROVIDER_SITE_OTHER): Payer: Managed Care, Other (non HMO) | Admitting: Obstetrics and Gynecology

## 2019-01-31 ENCOUNTER — Other Ambulatory Visit (HOSPITAL_COMMUNITY)
Admission: RE | Admit: 2019-01-31 | Discharge: 2019-01-31 | Disposition: A | Discharge status: Home / Self Care | Payer: Managed Care, Other (non HMO) | Source: Ambulatory Visit | Attending: Obstetrics and Gynecology | Admitting: Obstetrics and Gynecology

## 2019-01-31 ENCOUNTER — Ambulatory Visit (INDEPENDENT_AMBULATORY_CARE_PROVIDER_SITE_OTHER): Payer: Managed Care, Other (non HMO)

## 2019-01-31 ENCOUNTER — Encounter: Payer: Self-pay | Admitting: Obstetrics and Gynecology

## 2019-01-31 ENCOUNTER — Other Ambulatory Visit: Payer: Self-pay | Admitting: Obstetrics and Gynecology

## 2019-01-31 ENCOUNTER — Other Ambulatory Visit: Payer: Self-pay

## 2019-01-31 VITALS — BP 120/90 | Ht 71.0 in | Wt 246.0 lb

## 2019-01-31 DIAGNOSIS — N950 Postmenopausal bleeding: Secondary | ICD-10-CM

## 2019-01-31 NOTE — Progress Notes (Signed)
Gynecology Ultrasound Follow Up  Chief Complaint:  Chief Complaint  Patient presents with  . Follow-up    u/s     History of Present Illness: Patient is a 54 y.o. female who presents today for ultrasound evaluation of PMB.  No further episodes reported by patient.  Ultrasound demonstrates the following findgins Adnexa: no adnexal masses Uterus: Non-enlarged, no focal abnormalities with endometrial stripe 15.71mm Additional: no free fluid  Review of Systems: Review of Systems  Constitutional: Negative.   Gastrointestinal: Negative.   Genitourinary: Negative.     Past Medical History:  Past Medical History:  Diagnosis Date  . IFG (impaired fasting glucose)   . Medical history non-contributory   . Menorrhagia     Past Surgical History:  Past Surgical History:  Procedure Laterality Date  . COLONOSCOPY WITH PROPOFOL N/A 05/14/2015   Procedure: COLONOSCOPY WITH PROPOFOL;  Surgeon: Lucilla Lame, MD;  Location: [facility];  Service: Endoscopy;  Laterality: N/A;  . CYSTOSCOPY  07/21/2015   Procedure: CYSTOSCOPY;  Surgeon: Malachy Mood, MD;  Location: [facility];  Service: Gynecology;;  . DILATION AND CURETTAGE OF UTERUS  04/2008   Hysteroscopy; endometrial polyps  . LAPAROSCOPIC SALPINGO OOPHERECTOMY Bilateral 07/21/2015   Procedure: LAPAROSCOPIC LEFT SALPINGO OOPHORECTOMY, RIGHT SALPINGECTOMY;  Surgeon: Malachy Mood, MD;  Location: [facility];  Service: Gynecology;  Laterality: Bilateral;  . MOUTH SURGERY      Gynecologic History:  No LMP recorded. (Menstrual status: Perimenopausal). Last Pap: 10/09/2016 Results were: .no abnormalities  Family History:  Family History  Problem Relation Age of Onset  . Endometrial cancer Mother 31  . Kidney cancer Mother 46  . Hypertension Mother   . Melanoma Mother   . Breast cancer Paternal Grandmother 37  . Parkinson's disease Father   . Heart disease Maternal Grandmother        CHF  . Arthritis Maternal Grandmother     . Osteoporosis Maternal Grandmother   . Hypertension Maternal Grandfather   . Heart disease Maternal Grandfather   . Cancer Maternal Grandfather 79       bladder and pancreatic  . Ulcers Paternal Grandfather     Social History:  Social History   Socioeconomic History  . Marital status: Single    Spouse name: Not on file  . Number of children: Not on file  . Years of education: Not on file  . Highest education level: Not on file  Occupational History  . Not on file  Tobacco Use  . Smoking status: Never Smoker  . Smokeless tobacco: Never Used  Substance and Sexual Activity  . Alcohol use: Yes    Alcohol/week: 2.0 standard drinks    Types: 1 Glasses of wine, 1 Cans of beer per week    Comment:  occ  . Drug use: No  . Sexual activity: Never    Birth control/protection: None  Other Topics Concern  . Not on file  Social History Narrative  . Not on file   Social Determinants of Health   Financial Resource Strain:   . Difficulty of Paying Living Expenses: Not on file  Food Insecurity:   . Worried About Charity fundraiser in the Last Year: Not on file  . Ran Out of Food in the Last Year: Not on file  Transportation Needs:   . Lack of Transportation (Medical): Not on file  . Lack of Transportation (Non-Medical): Not on file  Physical Activity:   . Days of Exercise per Week: Not on file  .  Minutes of Exercise per Session: Not on file  Stress:   . Feeling of Stress : Not on file  Social Connections:   . Frequency of Communication with Friends and Family: Not on file  . Frequency of Social Gatherings with Friends and Family: Not on file  . Attends Religious Services: Not on file  . Active Member of Clubs or Organizations: Not on file  . Attends Archivist Meetings: Not on file  . Marital Status: Not on file  Intimate Partner Violence:   . Fear of Current or Ex-Partner: Not on file  . Emotionally Abused: Not on file  . Physically Abused: Not on file  .  Sexually Abused: Not on file    Allergies:  Allergies  Allergen Reactions  . Benazepril Shortness Of Breath and Other (See Comments)    Dizziness  . Amoxicillin Diarrhea and Nausea Only    Medications: Prior to Admission medications   Medication Sig Start Date End Date Taking? Authorizing Provider  Ascorbic Acid (VITAMIN C PO) Take 1 tablet by mouth daily.    Yes [provider]  hydrochlorothiazide (HYDRODIURIL) 25 MG tablet TAKE 1 TABLET(25 MG) BY MOUTH DAILY 11/11/18  Yes Cannady, Jolene T, NP  Multiple Vitamin (MULTI VITAMIN DAILY PO) Take 1 tablet by mouth daily.    Yes [provider]    Physical Exam Vitals: Blood pressure 120/90, height 5\' 11"  (1.803 m), weight 246 lb (111.6 kg).  General: NAD HEENT: normocephalic, anicteric Pulmonary: No increased work of breathing Extremities: no edema, erythema, or tenderness Neurologic: Grossly intact, normal gait Psychiatric: mood appropriate, affect full  MM 3D SCREEN BREAST BILATERAL  Result Date: 01/29/2019 CLINICAL DATA:  Screening. EXAM: DIGITAL SCREENING BILATERAL MAMMOGRAM WITH TOMO AND CAD COMPARISON:  Previous exam(s). ACR Breast Density Category b: There are scattered areas of fibroglandular density. FINDINGS: There are no findings suspicious for malignancy. Images were processed with CAD. IMPRESSION: No mammographic evidence of malignancy. A result letter of this screening mammogram will be mailed directly to the patient. RECOMMENDATION: Screening mammogram in one year. (Code:SM-B-01Y) BI-RADS CATEGORY  1: Negative. Electronically Signed   By: Ammie Ferrier M.D.   On: 01/29/2019 10:26   US PELVIC COMPLETE WITH TRANSVAGINAL  Result Date: 01/31/2019 Patient Name: Diann Debes DOB: 08-Feb-1965 MRN: [id number] ULTRASOUND REPORT Location: [facility] Date of Service: 01/31/2019 Indications:Abnormal Uterine Bleeding Findings: The uterus is anteverted and measures 7.5 x 4.5 x 3.5 cm. Echo texture is homogenous  without evidence of focal masses. The Endometrium measures 15.4 mm. Right Ovary is not visualized. Left oophorectomy. Survey of the adnexa demonstrates no adnexal masses. There is no free fluid in the cul de sac. Impression: 1. Thickned endometrium 2. Normal appearing cervix. 3. The right ovary is not visible. 4. Left oophorectomy. Recommendations: 1.Clinical correlation with the patient's History and Physical Exam. Gweneth Dimitri, RT Images reviewed.  Normal GYN study without visualized pathology.  In the setting of postmenopausal bleeding endometrial lining is considered thickened and endometrial biopsy is warranted. Malachy Mood, MD, [facility], [facility] 01/31/2019, 3:11 PM   ENDOMETRIAL BIOPSY     The indications for endometrial biopsy were reviewed.   Risks of the biopsy including cramping, bleeding, infection, uterine perforation, inadequate specimen and need for additional procedures  were discussed. The patient states she understands and agrees to undergo procedure today. Consent was signed. Time out was performed. Urine HCG was negative. A Graves speculum was placed and the cervix was brought into  view.  The cervix was prepped with Betadine. A single-toothed tenaculum was placed on the anterior lip of the cervix for traction. A 3 mm pipelle was introduced through the cervix into the endometrial cavity without difficulty to a depth of 7cm, and a small amount of tissue was obtained, the resulting specime sent to pathology. The instruments were removed from the patient's vagina. Minimal bleeding from the cervix was noted. The patient tolerated the procedure well. Routine post-procedure instructions were given to the patient.  She will be contacted by phone one results become available.     Assessment: 54 y.o. G1P0010 with postmenopausal bleeding  Plan: Problem List Items Addressed This Visit    None    Visit Diagnoses    PMB (postmenopausal bleeding)    -  Primary    Relevant Orders   Surgical pathology      1) Thickened endometrium in setting of PMB - endometrial biopsy obtained.  Has had no further episodes.  Final management decisions to hinge on results of pathology.  2) A total of 15 minutes were spent in face-to-face contact with the patient during this encounter with over half of that time devoted to counseling and coordination of care.  3) Return in about 9 months (around 10/31/2019) for annual.   Malachy Mood, MD, [place], [facility] 02/03/2019, 1:30 PM

## 2019-02-04 ENCOUNTER — Other Ambulatory Visit: Payer: Self-pay

## 2019-02-04 LAB — SURGICAL PATHOLOGY

## 2019-02-04 MED ORDER — HYDROCHLOROTHIAZIDE 25 MG PO TABS: ORAL_TABLET | ORAL | 0 refills | Status: DC

## 2019-02-04 NOTE — Telephone Encounter (Signed)
Patient last seen 11/26/18 and has appointment in May 2021

## 2019-02-20 ENCOUNTER — Telehealth: Payer: Managed Care, Other (non HMO) | Admitting: Nurse Practitioner

## 2019-02-21 ENCOUNTER — Ambulatory Visit: Payer: Managed Care, Other (non HMO) | Admitting: Family Medicine

## 2019-03-24 DIAGNOSIS — Z86018 Personal history of other benign neoplasm: Secondary | ICD-10-CM

## 2019-03-24 HISTORY — DX: Personal history of other benign neoplasm: Z86.018

## 2019-05-22 ENCOUNTER — Other Ambulatory Visit: Payer: Self-pay | Admitting: Nurse Practitioner

## 2019-06-04 ENCOUNTER — Other Ambulatory Visit: Payer: Self-pay

## 2019-06-04 ENCOUNTER — Ambulatory Visit (INDEPENDENT_AMBULATORY_CARE_PROVIDER_SITE_OTHER): Payer: Managed Care, Other (non HMO) | Admitting: Nurse Practitioner

## 2019-06-04 ENCOUNTER — Encounter: Payer: Self-pay | Admitting: Nurse Practitioner

## 2019-06-04 VITALS — BP 121/78 | HR 89 | Temp 97.7°F | Wt 252.0 lb

## 2019-06-04 DIAGNOSIS — I10 Essential (primary) hypertension: Secondary | ICD-10-CM

## 2019-06-04 DIAGNOSIS — Z6835 Body mass index (BMI) 35.0-35.9, adult: Secondary | ICD-10-CM

## 2019-06-04 DIAGNOSIS — E7800 Pure hypercholesterolemia, unspecified: Secondary | ICD-10-CM | POA: Insufficient documentation

## 2019-06-04 MED ORDER — HYDROCHLOROTHIAZIDE 25 MG PO TABS: 25.0000 mg | ORAL_TABLET | Freq: Every day | ORAL | 4 refills | Status: DC

## 2019-06-04 NOTE — Assessment & Plan Note (Signed)
Chronic, ongoing with BP at goal in office and at home.  Continue current medication regimen at this time and focus on diet and current exercise regimen.  Will follow-up in 6 months and if elevations consider addition of Amlodipine.  She will contact provider via My Chart with updated Excel sheet and if any consistent elevations noted. BMP and TSH today.

## 2019-06-04 NOTE — Patient Instructions (Signed)
DASH Eating Plan DASH stands for "Dietary Approaches to Stop Hypertension." The DASH eating plan is a healthy eating plan that has been shown to reduce high blood pressure (hypertension). It may also reduce your risk for type 2 diabetes, heart disease, and stroke. The DASH eating plan may also help with weight loss. What are tips for following this plan?  General guidelines  Avoid eating more than 2,300 mg (milligrams) of salt (sodium) a day. If you have hypertension, you may need to reduce your sodium intake to 1,500 mg a day.  Limit alcohol intake to no more than 1 drink a day for nonpregnant women and 2 drinks a day for men. One drink equals 12 oz of beer, 5 oz of wine, or 1 oz of hard liquor.  Work with your health care provider to maintain a healthy body weight or to lose weight. Ask what an ideal weight is for you.  Get at least 30 minutes of exercise that causes your heart to beat faster (aerobic exercise) most days of the week. Activities may include walking, swimming, or biking.  Work with your health care provider or diet and nutrition specialist (dietitian) to adjust your eating plan to your individual calorie needs. Reading food labels   Check food labels for the amount of sodium per serving. Choose foods with less than 5 percent of the Daily Value of sodium. Generally, foods with less than 300 mg of sodium per serving fit into this eating plan.  To find whole grains, look for the word "whole" as the first word in the ingredient list. Shopping  Buy products labeled as "low-sodium" or "no salt added."  Buy fresh foods. Avoid canned foods and premade or frozen meals. Cooking  Avoid adding salt when cooking. Use salt-free seasonings or herbs instead of table salt or sea salt. Check with your health care provider or pharmacist before using salt substitutes.  Do not fry foods. Cook foods using healthy methods such as baking, boiling, grilling, and broiling instead.  Cook with  heart-healthy oils, such as olive, canola, soybean, or sunflower oil. Meal planning  Eat a balanced diet that includes: ? 5 or more servings of fruits and vegetables each day. At each meal, try to fill half of your plate with fruits and vegetables. ? Up to 6-8 servings of whole grains each day. ? Less than 6 oz of lean meat, poultry, or fish each day. A 3-oz serving of meat is about the same size as a deck of cards. One egg equals 1 oz. ? 2 servings of low-fat dairy each day. ? A serving of nuts, seeds, or beans 5 times each week. ? Heart-healthy fats. Healthy fats called Omega-3 fatty acids are found in foods such as flaxseeds and coldwater fish, like sardines, salmon, and mackerel.  Limit how much you eat of the following: ? Canned or prepackaged foods. ? Food that is high in trans fat, such as fried foods. ? Food that is high in saturated fat, such as fatty meat. ? Sweets, desserts, sugary drinks, and other foods with added sugar. ? Full-fat dairy products.  Do not salt foods before eating.  Try to eat at least 2 vegetarian meals each week.  Eat more home-cooked food and less restaurant, buffet, and fast food.  When eating at a restaurant, ask that your food be prepared with less salt or no salt, if possible. What foods are recommended? The items listed may not be a complete list. Talk with your dietitian about   what dietary choices are best for you. Grains Whole-grain or whole-wheat bread. Whole-grain or whole-wheat pasta. Brown rice. Oatmeal. Quinoa. Bulgur. Whole-grain and low-sodium cereals. Pita bread. Low-fat, low-sodium crackers. Whole-wheat flour tortillas. Vegetables Fresh or frozen vegetables (raw, steamed, roasted, or grilled). Low-sodium or reduced-sodium tomato and vegetable juice. Low-sodium or reduced-sodium tomato sauce and tomato paste. Low-sodium or reduced-sodium canned vegetables. Fruits All fresh, dried, or frozen fruit. Canned fruit in natural juice (without  added sugar). Meat and other protein foods Skinless chicken or turkey. Ground chicken or turkey. Pork with fat trimmed off. Fish and seafood. Egg whites. Dried beans, peas, or lentils. Unsalted nuts, nut butters, and seeds. Unsalted canned beans. Lean cuts of beef with fat trimmed off. Low-sodium, lean deli meat. Dairy Low-fat (1%) or fat-free (skim) milk. Fat-free, low-fat, or reduced-fat cheeses. Nonfat, low-sodium ricotta or cottage cheese. Low-fat or nonfat yogurt. Low-fat, low-sodium cheese. Fats and oils Soft margarine without trans fats. Vegetable oil. Low-fat, reduced-fat, or light mayonnaise and salad dressings (reduced-sodium). Canola, safflower, olive, soybean, and sunflower oils. Avocado. Seasoning and other foods Herbs. Spices. Seasoning mixes without salt. Unsalted popcorn and pretzels. Fat-free sweets. What foods are not recommended? The items listed may not be a complete list. Talk with your dietitian about what dietary choices are best for you. Grains Baked goods made with fat, such as croissants, muffins, or some breads. Dry pasta or rice meal packs. Vegetables Creamed or fried vegetables. Vegetables in a cheese sauce. Regular canned vegetables (not low-sodium or reduced-sodium). Regular canned tomato sauce and paste (not low-sodium or reduced-sodium). Regular tomato and vegetable juice (not low-sodium or reduced-sodium). Pickles. Olives. Fruits Canned fruit in a light or heavy syrup. Fried fruit. Fruit in cream or butter sauce. Meat and other protein foods Fatty cuts of meat. Ribs. Fried meat. Bacon. Sausage. Bologna and other processed lunch meats. Salami. Fatback. Hotdogs. Bratwurst. Salted nuts and seeds. Canned beans with added salt. Canned or smoked fish. Whole eggs or egg yolks. Chicken or turkey with skin. Dairy Whole or 2% milk, cream, and half-and-half. Whole or full-fat cream cheese. Whole-fat or sweetened yogurt. Full-fat cheese. Nondairy creamers. Whipped toppings.  Processed cheese and cheese spreads. Fats and oils Butter. Stick margarine. Lard. Shortening. Ghee. Bacon fat. Tropical oils, such as coconut, palm kernel, or palm oil. Seasoning and other foods Salted popcorn and pretzels. Onion salt, garlic salt, seasoned salt, table salt, and sea salt. Worcestershire sauce. Tartar sauce. Barbecue sauce. Teriyaki sauce. Soy sauce, including reduced-sodium. Steak sauce. Canned and packaged gravies. Fish sauce. Oyster sauce. Cocktail sauce. Horseradish that you find on the shelf. Ketchup. Mustard. Meat flavorings and tenderizers. Bouillon cubes. Hot sauce and Tabasco sauce. Premade or packaged marinades. Premade or packaged taco seasonings. Relishes. Regular salad dressings. Where to find more information:  National Heart, Lung, and Blood Institute: www.nhlbi.nih.gov  American Heart Association: www.heart.org Summary  The DASH eating plan is a healthy eating plan that has been shown to reduce high blood pressure (hypertension). It may also reduce your risk for type 2 diabetes, heart disease, and stroke.  With the DASH eating plan, you should limit salt (sodium) intake to 2,300 mg a day. If you have hypertension, you may need to reduce your sodium intake to 1,500 mg a day.  When on the DASH eating plan, aim to eat more fresh fruits and vegetables, whole grains, lean proteins, low-fat dairy, and heart-healthy fats.  Work with your health care provider or diet and nutrition specialist (dietitian) to adjust your eating plan to your   individual calorie needs. This information is not intended to replace advice given to you by your health care provider. Make sure you discuss any questions you have with your health care provider. Document Revised: 12/22/2016 Document Reviewed: 01/03/2016 Elsevier Patient Education  2020 Elsevier Inc.  

## 2019-06-04 NOTE — Assessment & Plan Note (Signed)
No current medications and ASCVD 1.8%.  Recheck lipid panel, she is fasting.

## 2019-06-04 NOTE — Progress Notes (Signed)
BP 121/78   Pulse 89   Temp 97.7 F (36.5 C) (Oral)   Wt 252 lb (114.3 kg)   SpO2 100%   BMI 35.15 kg/m    Subjective:    Patient ID: Brooke Henson, female    DOB: 17-May-1965, 54 y.o.   MRN: [id number]  HPI: Brooke Henson is a 53 y.o. female  Chief Complaint  Patient presents with  . Hypertension   HYPERTENSION Continues on HCTZ 25 MG daily.  Last labs did note elevation in LDL at 116 in 2019.  Continues to work from home and walking at home + going to trainer twice a week. Hypertension status: stable  Satisfied with current treatment? yes Duration of hypertension: chronic BP monitoring frequency:  daily BP range: 120-130/80's average   BP medication side effects:  no Medication compliance: good compliance Aspirin: no Recurrent headaches: no Visual changes: no Palpitations: no Dyspnea: no Chest pain: no Lower extremity edema: no Dizzy/lightheaded: no  The 10-year ASCVD risk score Mikey Bussing DC Jr., et al., 2013) is: 1.8%   Values used to calculate the score:     Age: 62 years     Sex: Female     Is Non-Hispanic African American: No     Diabetic: No     Tobacco smoker: No     Systolic Blood Pressure: 123XX123 mmHg     Is BP treated: Yes     HDL Cholesterol: 44 mg/dL     Total Cholesterol: 179 mg/dL   Relevant past medical, surgical, family and social history reviewed and updated as indicated. Interim medical history since our last visit reviewed. Allergies and medications reviewed and updated.  Review of Systems  Constitutional: Negative for activity change, appetite change, diaphoresis, fatigue and fever.  Respiratory: Negative for cough, chest tightness and shortness of breath.   Cardiovascular: Negative for chest pain, palpitations and leg swelling.  Gastrointestinal: Negative.   Neurological: Negative.   Psychiatric/Behavioral: Negative.     Per HPI unless specifically indicated above     Objective:    BP 121/78   Pulse 89   Temp 97.7 F (36.5 C)  (Oral)   Wt 252 lb (114.3 kg)   SpO2 100%   BMI 35.15 kg/m   Wt Readings from Last 3 Encounters:  06/04/19 252 lb (114.3 kg)  01/31/19 246 lb (111.6 kg)  12/27/18 244 lb (110.7 kg)    Physical Exam Vitals and nursing note reviewed.  Constitutional:      General: She is awake. She is not in acute distress.    Appearance: She is well-developed and well-groomed. She is obese. She is not ill-appearing.  HENT:     Head: Normocephalic.     Right Ear: Hearing normal.     Left Ear: Hearing normal.  Eyes:     General: Lids are normal.        Right eye: No discharge.        Left eye: No discharge.     Conjunctiva/sclera: Conjunctivae normal.     Pupils: Pupils are equal, round, and reactive to light.  Neck:     Thyroid: No thyromegaly.     Vascular: No carotid bruit.  Cardiovascular:     Rate and Rhythm: Normal rate and regular rhythm.     Heart sounds: Normal heart sounds. No murmur. No gallop.   Pulmonary:     Effort: Pulmonary effort is normal. No accessory muscle usage or respiratory distress.     Breath sounds: Normal breath sounds.  Abdominal:     General: Bowel sounds are normal.     Palpations: Abdomen is soft.  Musculoskeletal:     Cervical back: Normal range of motion and neck supple.     Right lower leg: No edema.     Left lower leg: No edema.  Lymphadenopathy:     Cervical: No cervical adenopathy.  Skin:    General: Skin is warm and dry.  Neurological:     Mental Status: She is alert and oriented to person, place, and time.  Psychiatric:        Attention and Perception: Attention normal.        Mood and Affect: Mood normal.        Speech: Speech normal.        Behavior: Behavior normal. Behavior is cooperative.        Thought Content: Thought content normal.    Results for orders placed or performed in visit on 01/31/19  Surgical pathology  Result Value Ref Range   SURGICAL PATHOLOGY      SURGICAL PATHOLOGY CASE: [id number] PATIENT: [facility] Surgical Pathology Report     Clinical History: None provided     FINAL MICROSCOPIC DIAGNOSIS:  A. ENDOMETRIUM, BIOPSY: - Benign cervical glandular and squamous mucosa - Minute strip of benign atrophic endometrium, insufficient for definitive assessment      GROSS DESCRIPTION:  Received in formalin are 1.5 x 1.5 x 0.3 cm of blood-tinged mucus.  The specimen is submitted in toto.  [facility] 02/03/2019)   Final Diagnosis performed by Jaquita Folds, MD.   Electronically signed 02/04/2019 Technical and / or Professional components performed at [facility]. [facility], [place] [address], [place], [place] [address].  Immunohistochemistry Technical component (if applicable) was performed at [facility]. [address], [place], [place], [place] [address].   IMMUNOHISTOCHEMISTRY DISCLAIMER (if applicable): Some of these immunohistochemical stains may have been developed and the  performance characteristics determine by [facility]. Some may not have been cleared or approved by the U.S. Food and Drug Administration. The FDA has determined that such clearance or approval is not necessary. This test is used for clinical purposes. It should not be regarded as investigational or for research. This laboratory is certified under the [place] (CLIA-88) as qualified to perform high complexity clinical laboratory testing.  The controls stained appropriately.       Assessment & Plan:   Problem List Items Addressed This Visit      Cardiovascular and Mediastinum   Hypertension - Primary    Chronic, ongoing with BP at goal in office and at home.  Continue current medication regimen at this time and focus on diet and current exercise regimen.  Will follow-up in 6 months and if elevations consider addition of Amlodipine.  She will contact provider via My Chart with updated Excel sheet and if any consistent  elevations noted. BMP and TSH today.      Relevant Medications   hydrochlorothiazide (HYDRODIURIL) 25 MG tablet   Other Relevant Orders   Basic metabolic panel   Lipid Panel w/o Chol/HDL Ratio   TSH     Other   BMI 35.0-35.9,adult    Recommended eating smaller high protein, low fat meals more frequently and exercising 30 mins a day 5 times a week with a goal of 10-15lb weight loss in the next 3 months. Patient voiced their understanding and motivation to adhere to these recommendations.  Elevated LDL cholesterol level    No current medications and ASCVD 1.8%.  Recheck lipid panel, she is fasting.      Relevant Orders   Lipid Panel w/o Chol/HDL Ratio       Follow up plan: Return in about 6 months (around 12/05/2019).

## 2019-06-04 NOTE — Assessment & Plan Note (Signed)
Recommended eating smaller high protein, low fat meals more frequently and exercising 30 mins a day 5 times a week with a goal of 10-15lb weight loss in the next 3 months. Patient voiced their understanding and motivation to adhere to these recommendations.  

## 2019-06-05 LAB — BASIC METABOLIC PANEL
BUN/Creatinine Ratio: 22 (ref 9–23)
BUN: 17 mg/dL (ref 6–24)
CO2: 26 mmol/L (ref 20–29)
Calcium: 9.8 mg/dL (ref 8.7–10.2)
Chloride: 101 mmol/L (ref 96–106)
Creatinine, Ser: 0.78 mg/dL (ref 0.57–1.00)
GFR calc Af Amer: 102 mL/min/{1.73_m2} (ref >59)
GFR calc non Af Amer: 88 mL/min/{1.73_m2} (ref >59)
Glucose: 99 mg/dL (ref 65–99)
Potassium: 4.1 mmol/L (ref 3.5–5.2)
Sodium: 141 mmol/L (ref 134–144)

## 2019-06-05 LAB — LIPID PANEL W/O CHOL/HDL RATIO
Cholesterol, Total: 196 mg/dL (ref 100–199)
HDL: 50 mg/dL (ref >39)
LDL Chol Calc (NIH): 130 mg/dL — ABNORMAL HIGH (ref 0–99)
Triglycerides: 87 mg/dL (ref 0–149)
VLDL Cholesterol Cal: 16 mg/dL (ref 5–40)

## 2019-06-05 LAB — TSH: TSH: 1.61 u[IU]/mL (ref 0.450–4.500)

## 2019-06-05 NOTE — Progress Notes (Signed)
Contacted via [place] morning Brooke Henson, your labs have returned.  Kidney function continues to be good, as are electrolytes.  Thyroid is normal.  LDL (bad cholesterol) is still elevated, but overall risk score is low.  I would continue focus on healthy diet and exercise regimen, as overtime this risk can increase and there may be need to initiate medication to help prevent stroke or heart attack.  Let me know if any questions. Keep being awesome!! Kindest regards, Maddock Finigan

## 2019-08-15 ENCOUNTER — Other Ambulatory Visit: Payer: Self-pay | Admitting: Nurse Practitioner

## 2019-10-10 ENCOUNTER — Ambulatory Visit: Payer: Self-pay

## 2019-10-10 NOTE — Telephone Encounter (Signed)
Agree with disposition and care advice.  Please encourage patient to make [place] aware if she tests positive for COVID-19 as she is a candidate for Monoclonal antibodies.

## 2019-10-10 NOTE — Telephone Encounter (Signed)
Called pt to let her know of Jessica's message, pt verbalized understanding

## 2019-10-10 NOTE — Telephone Encounter (Signed)
Returned call to patient who states that she has sore throat runny nose and sinus pressure. She had a slight fever that she did not measure. Onset of symptoms was Tuesday night. She feels that her symptoms are much improved today.  She was calling to seek advice about testing for COVID-19.  She has been fully vaccinated. Patient has not been exposed to anyone with COVID-19. Patient was advised because of the wide spread in the area to go be tested. Care advice to use OTC medications rest and drink fluids was read to patient. She was told to stay home and quarantine until her test results. She was told to call back if posiitive for medical advice.  She verbalized understanding of all information and will test at local drug store.  Reason for Disposition . [1] HIGH RISK patient AND [2] influenza is widespread in the community AND [3] ONE OR MORE respiratory symptoms: cough, sore throat, runny or stuffy nose  Answer Assessment - Initial Assessment Questions 1. COVID-19 DIAGNOSIS: "Who made your Coronavirus (COVID-19) diagnosis?" "Was it confirmed by a positive lab test?" If not diagnosed by a HCP, ask "Are there lots of cases (community spread) where you live?" (See public health department website, if unsure)     No 2. COVID-19 EXPOSURE: "Was there any known exposure to [place] before the symptoms began?" CDC Definition of close contact: within 6 feet (2 meters) for a total of 15 minutes or more over a 24-hour period.     Not aware 3. ONSET: "When did the COVID-19 symptoms start?"      Tuesday evening 4. WORST SYMPTOM: "What is your worst symptom?" (e.g., cough, fever, shortness of breath, muscle aches)     Fever and nasal drainage 5. COUGH: "Do you have a cough?" If Yes, ask: "How bad is the cough?"       no 6. FEVER: "Do you have a fever?" If Yes, ask: "What is your temperature, how was it measured, and when did it start?"     Not measured but was feverish 7. RESPIRATORY STATUS: "Describe your  breathing?" (e.g., shortness of breath, wheezing, unable to speak)      no 8. BETTER-SAME-WORSE: "Are you getting better, staying the same or getting worse compared to yesterday?"  If getting worse, ask, "In what way?"     better 9. HIGH RISK DISEASE: "Do you have any chronic medical problems?" (e.g., asthma, heart or lung disease, weak immune system, obesity, etc.)     no 10. PREGNANCY: "Is there any chance you are pregnant?" "When was your last menstrual period?"      N/A  11. OTHER SYMPTOMS: "Do you have any other symptoms?"  (e.g., chills, fatigue, headache, loss of smell or taste, muscle pain, sore throat; new loss of smell or taste especially support the diagnosis of COVID-19)      Slight HA  Protocols used: CORONAVIRUS (COVID-19) DIAGNOSED OR SUSPECTED-A-AH

## 2019-12-05 ENCOUNTER — Encounter: Payer: Self-pay | Admitting: Nurse Practitioner

## 2019-12-05 ENCOUNTER — Other Ambulatory Visit: Payer: Self-pay

## 2019-12-05 ENCOUNTER — Ambulatory Visit (INDEPENDENT_AMBULATORY_CARE_PROVIDER_SITE_OTHER): Payer: Managed Care, Other (non HMO) | Admitting: Nurse Practitioner

## 2019-12-05 VITALS — BP 112/75 | HR 80 | Temp 98.5°F | Wt 255.2 lb

## 2019-12-05 DIAGNOSIS — Z1159 Encounter for screening for other viral diseases: Secondary | ICD-10-CM

## 2019-12-05 DIAGNOSIS — H9313 Tinnitus, bilateral: Secondary | ICD-10-CM

## 2019-12-05 DIAGNOSIS — Z6835 Body mass index (BMI) 35.0-35.9, adult: Secondary | ICD-10-CM

## 2019-12-05 DIAGNOSIS — Z23 Encounter for immunization: Secondary | ICD-10-CM | POA: Diagnosis not present

## 2019-12-05 DIAGNOSIS — I10 Essential (primary) hypertension: Secondary | ICD-10-CM

## 2019-12-05 DIAGNOSIS — E7800 Pure hypercholesterolemia, unspecified: Secondary | ICD-10-CM

## 2019-12-05 NOTE — Assessment & Plan Note (Addendum)
No current medications and ASCVD 1.5%.  Recheck lipid panel next visit, she is not fasting today.  Continue diet and exercise focus.

## 2019-12-05 NOTE — Assessment & Plan Note (Signed)
Chronic, ongoing with BP at goal in office and at home.  Continue current medication regimen at this time and focus on diet and current exercise regimen.  Will follow-up in 6 months and if elevations consider addition of Amlodipine.  BMP and TSH today.

## 2019-12-05 NOTE — Progress Notes (Signed)
BP 112/75   Pulse 80   Temp 98.5 F (36.9 C) (Oral)   Wt 255 lb 3.2 oz (115.8 kg)   LMP  (LMP Unknown)   SpO2 97%   BMI 35.59 kg/m    Subjective:    Patient ID: Brooke Henson, female    DOB: 21-Nov-1965, 54 y.o.   MRN: [id number]  HPI: Brooke Henson is a 54 y.o. female  Chief Complaint  Patient presents with  . Hypertension   HYPERTENSION Continues on HCTZ 25 MG daily.  Last labs did note elevation in LDL at 130 on 06/04/19.  Continues to work with trainer twice a week. Hypertension status: stable  Satisfied with current treatment? yes Duration of hypertension: chronic BP monitoring frequency:  daily BP range: 120-130/80's average   BP medication side effects:  no Medication compliance: good compliance Aspirin: no Recurrent headaches: no Visual changes: no Palpitations: no Dyspnea: no Chest pain: no Lower extremity edema: no Dizzy/lightheaded: no  The 10-year ASCVD risk score Mikey Bussing DC Jr., et al., 2013) is: 1.5%   Values used to calculate the score:     Age: 37 years     Sex: Female     Is Non-Hispanic African American: No     Diabetic: No     Tobacco smoker: No     Systolic Blood Pressure: 030 mmHg     Is BP treated: Yes     HDL Cholesterol: 50 mg/dL     Total Cholesterol: 196 mg/dL  TINNITUS Has been present to both ears for at least 6 months, does not wear ear buds often.  R>L.  Currently taking supplement and drinking water more == this improves it.   Duration: months Description of tinnitus: ringing Pulsatile: no Tinnitus duration: hours Episode frequency: recurrent Severity: mild Aggravating factors: nothing Alleviating factors: increasing water intake Head injury: no Chronic exposure to loud noises: no Exposure to ototoxic medications: no Vertigo:no Hearing loss: no Aural fullness: no Headache:no  TMJ syndrome symptoms: no Unsteady gait: no Postural instability: no Diplopia, dysarthria, dysphagia or weakness: no Anxiety/depression:  no  Relevant past medical, surgical, family and social history reviewed and updated as indicated. Interim medical history since our last visit reviewed. Allergies and medications reviewed and updated.  Review of Systems  Constitutional: Negative for activity change, appetite change, diaphoresis, fatigue and fever.  Respiratory: Negative for cough, chest tightness and shortness of breath.   Cardiovascular: Negative for chest pain, palpitations and leg swelling.  Gastrointestinal: Negative.   Neurological: Negative.   Psychiatric/Behavioral: Negative.     Per HPI unless specifically indicated above     Objective:    BP 112/75   Pulse 80   Temp 98.5 F (36.9 C) (Oral)   Wt 255 lb 3.2 oz (115.8 kg)   LMP  (LMP Unknown)   SpO2 97%   BMI 35.59 kg/m   Wt Readings from Last 3 Encounters:  12/05/19 255 lb 3.2 oz (115.8 kg)  06/04/19 252 lb (114.3 kg)  01/31/19 246 lb (111.6 kg)    Physical Exam Vitals and nursing note reviewed.  Constitutional:      General: She is awake. She is not in acute distress.    Appearance: She is well-developed and well-groomed. She is obese. She is not ill-appearing.  HENT:     Head: Normocephalic.     Right Ear: Hearing, ear canal and external ear normal. There is impacted cerumen.     Left Ear: Hearing, tympanic membrane, ear canal and external ear  normal.  Eyes:     General: Lids are normal.        Right eye: No discharge.        Left eye: No discharge.     Conjunctiva/sclera: Conjunctivae normal.     Pupils: Pupils are equal, round, and reactive to light.  Neck:     Thyroid: No thyromegaly.     Vascular: No carotid bruit.  Cardiovascular:     Rate and Rhythm: Normal rate and regular rhythm.     Heart sounds: Normal heart sounds. No murmur heard.  No gallop.   Pulmonary:     Effort: Pulmonary effort is normal. No accessory muscle usage or respiratory distress.     Breath sounds: Normal breath sounds.  Abdominal:     General: Bowel  sounds are normal.     Palpations: Abdomen is soft.  Musculoskeletal:     Cervical back: Normal range of motion and neck supple.     Right lower leg: No edema.     Left lower leg: No edema.  Lymphadenopathy:     Cervical: No cervical adenopathy.  Skin:    General: Skin is warm and dry.  Neurological:     Mental Status: She is alert and oriented to person, place, and time.  Psychiatric:        Attention and Perception: Attention normal.        Mood and Affect: Mood normal.        Speech: Speech normal.        Behavior: Behavior normal. Behavior is cooperative.        Thought Content: Thought content normal.    Results for orders placed or performed in visit on 51/02/58  Basic metabolic panel  Result Value Ref Range   Glucose 99 65 - 99 mg/dL   BUN 17 6 - 24 mg/dL   Creatinine, Ser 0.78 0.57 - 1.00 mg/dL   GFR calc non Af Amer 88 >59 mL/min/1.73   GFR calc Af Amer 102 >59 mL/min/1.73   BUN/Creatinine Ratio 22 9 - 23   Sodium 141 134 - 144 mmol/L   Potassium 4.1 3.5 - 5.2 mmol/L   Chloride 101 96 - 106 mmol/L   CO2 26 20 - 29 mmol/L   Calcium 9.8 8.7 - 10.2 mg/dL  Lipid Panel w/o Chol/HDL Ratio  Result Value Ref Range   Cholesterol, Total 196 100 - 199 mg/dL   Triglycerides 87 0 - 149 mg/dL   HDL 50 >39 mg/dL   VLDL Cholesterol Cal 16 5 - 40 mg/dL   LDL Chol Calc (NIH) 130 (H) 0 - 99 mg/dL  TSH  Result Value Ref Range   TSH 1.610 0.450 - 4.500 uIU/mL      Assessment & Plan:   Problem List Items Addressed This Visit      Cardiovascular and Mediastinum   Hypertension - Primary    Chronic, ongoing with BP at goal in office and at home.  Continue current medication regimen at this time and focus on diet and current exercise regimen.  Will follow-up in 6 months and if elevations consider addition of Amlodipine.  BMP and TSH today.      Relevant Orders   Basic metabolic panel   TSH     Other   BMI 35.0-35.9,adult    Recommended eating smaller high protein, low fat  meals more frequently and exercising 30 mins a day 5 times a week with a goal of 10-15lb weight loss in the next  3 months. Patient voiced their understanding and motivation to adhere to these recommendations.       Elevated LDL cholesterol level    No current medications and ASCVD 1.5%.  Recheck lipid panel next visit, she is not fasting today.  Continue diet and exercise focus.      Tinnitus aurium, bilateral    On and off for 6 months, R>L at this time.  On exam moderate cerumen noted in right ear.  She plans to go home and use Debrox to cleanse ear.  If ongoing tinnitus issues she will return to office.       Other Visit Diagnoses    Need for hepatitis C screening test       Hep C screening on labs today.   Relevant Orders   Hepatitis C antibody   Need for influenza vaccination       Flu vaccine today.   Relevant Orders   Flu Vaccine QUAD 36+ mos IM (Completed)       Follow up plan: Return in about 6 months (around 06/03/2020) for HTN/HLD.

## 2019-12-05 NOTE — Patient Instructions (Addendum)
Debrox to clean ears  Influenza (Flu) Vaccine (Inactivated or Recombinant): What You Need to Know 1. Why get vaccinated? Influenza vaccine can prevent influenza (flu). Flu is a contagious disease that spreads around the [place] every year, usually between October and May. Anyone can get the flu, but it is more dangerous for some people. Infants and young children, people 52 years of age and older, pregnant women, and people with certain health conditions or a weakened immune system are at greatest risk of flu complications. Pneumonia, bronchitis, sinus infections and ear infections are examples of flu-related complications. If you have a medical condition, such as heart disease, cancer or diabetes, flu can make it worse. Flu can cause fever and chills, sore throat, muscle aches, fatigue, cough, headache, and runny or stuffy nose. Some people may have vomiting and diarrhea, though this is more common in children than adults. Each year thousands of people in the [place] die from flu, and many more are hospitalized. Flu vaccine prevents millions of illnesses and flu-related visits to the doctor each year. 2. Influenza vaccine CDC recommends everyone 48 months of age and older get vaccinated every flu season. Children 6 months through 11 years of age may need 2 doses during a single flu season. Everyone else needs only 1 dose each flu season. It takes about 2 weeks for protection to develop after vaccination. There are many flu viruses, and they are always changing. Each year a new flu vaccine is made to protect against three or four viruses that are likely to cause disease in the upcoming flu season. Even when the vaccine doesn't exactly match these viruses, it may still provide some protection. Influenza vaccine does not cause flu. Influenza vaccine may be given at the same time as other vaccines. 3. Talk with your health care provider Tell your vaccine provider if the person getting the  vaccine:  Has had an allergic reaction after a previous dose of influenza vaccine, or has any severe, life-threatening allergies.  Has ever had Guillain-Barr Syndrome (also called GBS). In some cases, your health care provider may decide to postpone influenza vaccination to a future visit. People with minor illnesses, such as a cold, may be vaccinated. People who are moderately or severely ill should usually wait until they recover before getting influenza vaccine. Your health care provider can give you more information. 4. Risks of a vaccine reaction  Soreness, redness, and swelling where shot is given, fever, muscle aches, and headache can happen after influenza vaccine.  There may be a very small increased risk of Guillain-Barr Syndrome (GBS) after inactivated influenza vaccine (the flu shot). Young children who get the flu shot along with pneumococcal vaccine (PCV13), and/or DTaP vaccine at the same time might be slightly more likely to have a seizure caused by fever. Tell your health care provider if a child who is getting flu vaccine has ever had a seizure. People sometimes faint after medical procedures, including vaccination. Tell your provider if you feel dizzy or have vision changes or ringing in the ears. As with any medicine, there is a very remote chance of a vaccine causing a severe allergic reaction, other serious injury, or death. 5. What if there is a serious problem? An allergic reaction could occur after the vaccinated person leaves the clinic. If you see signs of a severe allergic reaction (hives, swelling of the face and throat, difficulty breathing, a fast heartbeat, dizziness, or weakness), call 9-1-1 and get the person to the nearest  hospital. For other signs that concern you, call your health care provider. Adverse reactions should be reported to the Vaccine Adverse Event Reporting System (VAERS). Your health care provider will usually file this report, or you can do it  yourself. Visit the VAERS website at www.vaers.SamedayNews.es or call (450)655-7231.VAERS is only for reporting reactions, and VAERS staff do not give medical advice. 6. The National Vaccine Injury Compensation Program The [employer] Vaccine Injury Compensation Program (VICP) is a federal program that was created to compensate people who may have been injured by certain vaccines. Visit the VICP website at GoldCloset.com.ee or call (224) 502-2585 to learn about the program and about filing a claim. There is a time limit to file a claim for compensation. 7. How can I learn more?  Ask your healthcare provider.  Call your local or state health department.  Contact the Centers for Disease Control and Prevention (CDC): ? Call (941)451-0899 (1-800-CDC-INFO) or ? Visit CDC's https://gibson.com/ Vaccine Information Statement (Interim) Inactivated Influenza Vaccine (09/06/2017) This information is not intended to replace advice given to you by your health care provider. Make sure you discuss any questions you have with your health care provider. Document Revised: 04/30/2018 Document Reviewed: 09/10/2017 Elsevier Patient Education  [place].

## 2019-12-05 NOTE — Assessment & Plan Note (Signed)
Recommended eating smaller high protein, low fat meals more frequently and exercising 30 mins a day 5 times a week with a goal of 10-15lb weight loss in the next 3 months. Patient voiced their understanding and motivation to adhere to these recommendations.  

## 2019-12-05 NOTE — Assessment & Plan Note (Signed)
On and off for 6 months, R>L at this time.  On exam moderate cerumen noted in right ear.  She plans to go home and use Debrox to cleanse ear.  If ongoing tinnitus issues she will return to office.

## 2019-12-06 LAB — BASIC METABOLIC PANEL
BUN/Creatinine Ratio: 21 (ref 9–23)
BUN: 16 mg/dL (ref 6–24)
CO2: 25 mmol/L (ref 20–29)
Calcium: 9.9 mg/dL (ref 8.7–10.2)
Chloride: 102 mmol/L (ref 96–106)
Creatinine, Ser: 0.76 mg/dL (ref 0.57–1.00)
GFR calc Af Amer: 105 mL/min/{1.73_m2} (ref >59)
GFR calc non Af Amer: 91 mL/min/{1.73_m2} (ref >59)
Glucose: 144 mg/dL — ABNORMAL HIGH (ref 65–99)
Potassium: 4 mmol/L (ref 3.5–5.2)
Sodium: 141 mmol/L (ref 134–144)

## 2019-12-06 LAB — TSH: TSH: 0.965 u[IU]/mL (ref 0.450–4.500)

## 2019-12-06 LAB — HEPATITIS C ANTIBODY: Hep C Virus Ab: <0.1 s/co ratio (ref 0.0–0.9)

## 2019-12-07 ENCOUNTER — Other Ambulatory Visit: Payer: Self-pay | Admitting: Nurse Practitioner

## 2019-12-07 DIAGNOSIS — R7309 Other abnormal glucose: Secondary | ICD-10-CM

## 2019-12-07 NOTE — Progress Notes (Signed)
Contacted via MyChart  Good morning Brooke Henson, your labs have returned.  Overall, everything remains baseline and Hep C is negative.  However, glucose (sugar) level is elevated.  Were you fasting for these labs?  I would like to check an A1C to ensure no diabetes is present, as you have had elevations in past too on this when I reviewed past labs.  Could you please scheduled an outpatient lab visit over next week or two so we can check this?  This is outpatient lab visit only at office, you can respond to this message and my nursing staff may be able to help schedule this.  Any questions? Keep being awesome!!  Thank you for allowing me to participate in your care. Kindest regards, Ronnita Paz

## 2019-12-29 ENCOUNTER — Other Ambulatory Visit: Payer: Self-pay

## 2019-12-29 ENCOUNTER — Encounter: Payer: Self-pay | Admitting: Obstetrics and Gynecology

## 2019-12-29 ENCOUNTER — Ambulatory Visit (INDEPENDENT_AMBULATORY_CARE_PROVIDER_SITE_OTHER): Payer: Managed Care, Other (non HMO) | Admitting: Obstetrics and Gynecology

## 2019-12-29 VITALS — BP 132/78 | Ht 71.0 in | Wt 259.0 lb

## 2019-12-29 DIAGNOSIS — Z1239 Encounter for other screening for malignant neoplasm of breast: Secondary | ICD-10-CM | POA: Diagnosis not present

## 2019-12-29 DIAGNOSIS — Z01419 Encounter for gynecological examination (general) (routine) without abnormal findings: Secondary | ICD-10-CM

## 2019-12-29 NOTE — Progress Notes (Signed)
Gynecology Annual Exam  PCP: Venita Lick, NP  Chief Complaint:  Chief Complaint  Patient presents with   Gynecologic Exam    Annual. RM 5    History of Present Illness:Patient is a 54 y.o. G1P0010 presents for annual exam. The patient has no complaints today.   LMP: No LMP recorded (lmp unknown). (Menstrual status: Perimenopausal). No menstrual concerns   The patient does perform self breast exams.  There is no notable family history of breast or ovarian cancer in her family.  The patient wears seatbelts: yes.   The patient has regular exercise: no.    The patient denies current symptoms of depression.     Review of Systems: Review of Systems  Constitutional: Negative.  Negative for chills and fever.  HENT: Negative for congestion.   Respiratory: Negative for cough and shortness of breath.   Cardiovascular: Negative for chest pain and palpitations.  Gastrointestinal: Negative.  Negative for abdominal pain, constipation, diarrhea, heartburn, nausea and vomiting.  Genitourinary: Negative.  Negative for dysuria, frequency and urgency.  Skin: Negative for itching and rash.  Neurological: Negative for dizziness and headaches.  Endo/Heme/Allergies: Negative for polydipsia.  Psychiatric/Behavioral: Negative for depression.    Past Medical History:  Patient Active Problem List   Diagnosis Date Noted   Tinnitus aurium, bilateral 12/05/2019   Elevated LDL cholesterol level 06/04/2019   BMI 35.0-35.9,adult 02/21/2018   Ovarian mass, left 07/07/2015   Chronic constipation    Hypertension 03/09/2015    Past Surgical History:  Past Surgical History:  Procedure Laterality Date   COLONOSCOPY WITH PROPOFOL N/A 05/14/2015   Procedure: COLONOSCOPY WITH PROPOFOL;  Surgeon: Lucilla Lame, MD;  Location: [facility];  Service: Endoscopy;  Laterality: N/A;   CYSTOSCOPY  07/21/2015   Procedure: CYSTOSCOPY;  Surgeon: Malachy Mood, MD;  Location: [facility];   Service: Gynecology;;   DILATION AND CURETTAGE OF UTERUS  04/2008   Hysteroscopy; endometrial polyps   LAPAROSCOPIC SALPINGO OOPHERECTOMY Bilateral 07/21/2015   Procedure: LAPAROSCOPIC LEFT SALPINGO OOPHORECTOMY, RIGHT SALPINGECTOMY;  Surgeon: Malachy Mood, MD;  Location: [facility];  Service: Gynecology;  Laterality: Bilateral;   MOUTH SURGERY      Gynecologic History:  No LMP recorded (lmp unknown). (Menstrual status: Perimenopausal). Last Pap: Results were: 10/09/2016 NIL and HR HPV negative  Last mammogram: 02/08/2019 Results were: BI-RAD I  Obstetric History: G1P0010  Family History:  Family History  Problem Relation Age of Onset   Endometrial cancer Mother 91   Kidney cancer Mother 76   Hypertension Mother    Melanoma Mother    Diabetes Mother    Breast cancer Paternal Grandmother 68   Parkinson's disease Father    Heart disease Maternal Grandmother        CHF   Arthritis Maternal Grandmother    Osteoporosis Maternal Grandmother    Hypertension Maternal Grandfather    Heart disease Maternal Grandfather    Cancer Maternal Grandfather 79       bladder and pancreatic   Ulcers Paternal Grandfather     Social History:  Social History   Socioeconomic History   Marital status: Single    Spouse name: Not on file   Number of children: Not on file   Years of education: Not on file   Highest education level: Not on file  Occupational History   Not on file  Tobacco Use   Smoking status: Never Smoker   Smokeless tobacco: Never Used  Vaping Use   Vaping Use: Never  used  Substance and Sexual Activity   Alcohol use: Yes    Alcohol/week: 2.0 standard drinks    Types: 1 Glasses of wine, 1 Cans of beer per week    Comment:  occ   Drug use: No   Sexual activity: Never    Birth control/protection: None  Other Topics Concern   Not on file  Social History Narrative   Not on file   Social Determinants of Health   Financial Resource Strain:     Difficulty of Paying Living Expenses: Not on file  Food Insecurity:    Worried About Charity fundraiser in the Last Year: Not on file   [employer] of Food in the Last Year: Not on file  Transportation Needs:    Lack of Transportation (Medical): Not on file   Lack of Transportation (Non-Medical): Not on file  Physical Activity:    Days of Exercise per Week: Not on file   Minutes of Exercise per Session: Not on file  Stress:    Feeling of Stress : Not on file  Social Connections:    Frequency of Communication with Friends and Family: Not on file   Frequency of Social Gatherings with Friends and Family: Not on file   Attends Religious Services: Not on file   Active Member of Clubs or Organizations: Not on file   Attends Archivist Meetings: Not on file   Marital Status: Not on file  Intimate Partner Violence:    Fear of Current or Ex-Partner: Not on file   Emotionally Abused: Not on file   Physically Abused: Not on file   Sexually Abused: Not on file    Allergies:  Allergies  Allergen Reactions   Benazepril Shortness Of Breath and Other (See Comments)    Dizziness   Amoxicillin Diarrhea and Nausea Only    Medications: Prior to Admission medications   Medication Sig Start Date End Date Taking? Authorizing Provider  Ascorbic Acid (VITAMIN C PO) Take 1 tablet by mouth daily.    Yes [provider]  hydrochlorothiazide (HYDRODIURIL) 25 MG tablet TAKE 1 TABLET(25 MG) BY MOUTH DAILY 08/15/19  Yes Cannady, Jolene T, NP  metroNIDAZOLE (METROCREAM) 0.75 % cream Apply 1 application topically 2 (two) times daily.   Yes [provider]  Multiple Vitamin (MULTI VITAMIN DAILY PO) Take 1 tablet by mouth daily.    Yes [provider]    Physical Exam Vitals: Blood pressure 132/78, height 5\' 11"  (1.803 m), weight 259 lb (117.5 kg).  General: NAD HEENT: normocephalic, anicteric Thyroid: no enlargement, no palpable nodules Pulmonary:  No increased work of breathing, CTAB Cardiovascular: RRR, distal pulses 2+ Breast: Breast symmetrical, no tenderness, no palpable nodules or masses, no skin or nipple retraction present, no nipple discharge.  No axillary or supraclavicular lymphadenopathy. Abdomen: NABS, soft, non-tender, non-distended.  Umbilicus without lesions.  No hepatomegaly, splenomegaly or masses palpable. No evidence of hernia  Genitourinary:  External: Normal external female genitalia.  Normal urethral meatus, normal Bartholin's and Skene's glands.    Vagina: Normal vaginal mucosa, no evidence of prolapse.    Cervix: Grossly normal in appearance, no bleeding  Uterus: Non-enlarged, mobile, normal contour.  No CMT  Adnexa: ovaries non-enlarged, no adnexal masses  Rectal: deferred  Lymphatic: no evidence of inguinal lymphadenopathy Extremities: no edema, erythema, or tenderness Neurologic: Grossly intact Psychiatric: mood appropriate, affect full  Female chaperone present for pelvic and breast  portions of the physical exam  Immunization History  Administered  Date(s) Administered   Influenza,inj,Quad PF,6+ Mos 11/22/2018, 12/05/2019   PFIZER SARS-COV-2 Vaccination 04/25/2019, 05/23/2019   Tdap 07/14/2010      Assessment: 54 y.o. G1P0010 routine annual exam  Plan: Problem List Items Addressed This Visit    None    Visit Diagnoses    Encounter for gynecological examination without abnormal finding    -  Primary   Breast screening          1) Mammogram - recommend yearly screening mammogram.  Mammogram Is up to date  2) STI screening  was notoffered and therefore not obtained  3) ASCCP guidelines and rational discussed.  Patient opts for every 5 years screening interval  4) Osteoporosis  - per USPTF routine screening DEXA at age 89  5) Routine healthcare maintenance including cholesterol, diabetes screening discussed managed by PCP  6) Colonoscopy - UTD 05/14/2015  7) Return in about 1 year  (around 12/28/2020) for annual.    Malachy Mood, MD [facility] Pigeon, [facility] 12/29/2019, 4:16 PM

## 2019-12-29 NOTE — Progress Notes (Signed)
Annual. RM 5

## 2020-01-24 DIAGNOSIS — Z1371 Encounter for nonprocreative screening for genetic disease carrier status: Secondary | ICD-10-CM

## 2020-01-24 DIAGNOSIS — Z9189 Other specified personal risk factors, not elsewhere classified: Secondary | ICD-10-CM

## 2020-01-24 HISTORY — DX: Other specified personal risk factors, not elsewhere classified: Z91.89

## 2020-01-24 HISTORY — DX: Encounter for nonprocreative screening for genetic disease carrier status: Z13.71

## 2020-02-05 ENCOUNTER — Encounter: Payer: Self-pay | Admitting: Obstetrics and Gynecology

## 2020-02-05 ENCOUNTER — Other Ambulatory Visit: Payer: Self-pay | Admitting: Obstetrics and Gynecology

## 2020-02-05 DIAGNOSIS — Z803 Family history of malignant neoplasm of breast: Secondary | ICD-10-CM

## 2020-02-05 DIAGNOSIS — Z800 Family history of malignant neoplasm of digestive organs: Secondary | ICD-10-CM

## 2020-02-05 NOTE — Progress Notes (Signed)
MyRisk order, done with AMS 12/29/19

## 2020-03-01 ENCOUNTER — Other Ambulatory Visit: Payer: Self-pay | Admitting: Obstetrics and Gynecology

## 2020-03-01 DIAGNOSIS — Z1231 Encounter for screening mammogram for malignant neoplasm of breast: Secondary | ICD-10-CM

## 2020-03-08 ENCOUNTER — Other Ambulatory Visit: Payer: Self-pay

## 2020-03-08 ENCOUNTER — Ambulatory Visit
Admission: RE | Admit: 2020-03-08 | Discharge: 2020-03-08 | Disposition: A | Discharge status: Home / Self Care | Payer: Managed Care, Other (non HMO) | Source: Ambulatory Visit | Attending: Obstetrics and Gynecology | Admitting: Obstetrics and Gynecology

## 2020-03-08 DIAGNOSIS — Z1231 Encounter for screening mammogram for malignant neoplasm of breast: Secondary | ICD-10-CM | POA: Diagnosis present

## 2020-03-23 ENCOUNTER — Other Ambulatory Visit: Payer: Self-pay

## 2020-03-23 ENCOUNTER — Ambulatory Visit (INDEPENDENT_AMBULATORY_CARE_PROVIDER_SITE_OTHER): Payer: Managed Care, Other (non HMO) | Admitting: Dermatology

## 2020-03-23 DIAGNOSIS — L578 Other skin changes due to chronic exposure to nonionizing radiation: Secondary | ICD-10-CM

## 2020-03-23 DIAGNOSIS — Z86018 Personal history of other benign neoplasm: Secondary | ICD-10-CM

## 2020-03-23 DIAGNOSIS — Q825 Congenital non-neoplastic nevus: Secondary | ICD-10-CM

## 2020-03-23 DIAGNOSIS — L719 Rosacea, unspecified: Secondary | ICD-10-CM

## 2020-03-23 DIAGNOSIS — L309 Dermatitis, unspecified: Secondary | ICD-10-CM | POA: Diagnosis not present

## 2020-03-23 DIAGNOSIS — Z1283 Encounter for screening for malignant neoplasm of skin: Secondary | ICD-10-CM

## 2020-03-23 DIAGNOSIS — D229 Melanocytic nevi, unspecified: Secondary | ICD-10-CM

## 2020-03-23 DIAGNOSIS — D2262 Melanocytic nevi of left upper limb, including shoulder: Secondary | ICD-10-CM | POA: Diagnosis not present

## 2020-03-23 DIAGNOSIS — D2371 Other benign neoplasm of skin of right lower limb, including hip: Secondary | ICD-10-CM

## 2020-03-23 DIAGNOSIS — D2261 Melanocytic nevi of right upper limb, including shoulder: Secondary | ICD-10-CM

## 2020-03-23 DIAGNOSIS — D485 Neoplasm of uncertain behavior of skin: Secondary | ICD-10-CM

## 2020-03-23 DIAGNOSIS — D1800 Hemangioma unspecified site: Secondary | ICD-10-CM

## 2020-03-23 DIAGNOSIS — D225 Melanocytic nevi of trunk: Secondary | ICD-10-CM | POA: Diagnosis not present

## 2020-03-23 DIAGNOSIS — D2372 Other benign neoplasm of skin of left lower limb, including hip: Secondary | ICD-10-CM

## 2020-03-23 DIAGNOSIS — L814 Other melanin hyperpigmentation: Secondary | ICD-10-CM

## 2020-03-23 DIAGNOSIS — Z808 Family history of malignant neoplasm of other organs or systems: Secondary | ICD-10-CM

## 2020-03-23 DIAGNOSIS — L7211 Pilar cyst: Secondary | ICD-10-CM

## 2020-03-23 DIAGNOSIS — D239 Other benign neoplasm of skin, unspecified: Secondary | ICD-10-CM

## 2020-03-23 HISTORY — DX: Other benign neoplasm of skin, unspecified: D23.9

## 2020-03-23 MED ORDER — METRONIDAZOLE 0.75 % EX CREA: 1.0000 "application " | TOPICAL_CREAM | Freq: Every day | CUTANEOUS | 5 refills | Status: AC

## 2020-03-23 MED ORDER — CLOBETASOL PROPIONATE 0.05 % EX CREA: TOPICAL_CREAM | CUTANEOUS | 1 refills | Status: AC

## 2020-03-23 NOTE — Patient Instructions (Addendum)
Wound Care Instructions  1. Cleanse wound gently with soap and water once a day then pat dry with clean gauze. Apply a thing coat of Petrolatum (petroleum jelly, "Vaseline") over the wound (unless you have an allergy to this). We recommend that you use a new, sterile tube of Vaseline. Do not pick or remove scabs. Do not remove the yellow or white "healing tissue" from the base of the wound.  2. Cover the wound with fresh, clean, nonstick gauze and secure with paper tape. You may use Band-Aids in place of gauze and tape if the would is small enough, but would recommend trimming much of the tape off as there is often too much. Sometimes Band-Aids can irritate the skin.  3. You should call the office for your biopsy report after 1 week if you have not already been contacted.  4. If you experience any problems, such as abnormal amounts of bleeding, swelling, significant bruising, significant pain, or evidence of infection, please call the office immediately.  5. FOR ADULT SURGERY PATIENTS: If you need something for pain relief you may take 1 extra strength Tylenol (acetaminophen) AND 2 Ibuprofen (200mg  each) together every 4 hours as needed for pain. (do not take these if you are allergic to them or if you have a reason you should not take them.) Typically, you may only need pain medication for 1 to 3 days.    Topical steroids (such as triamcinolone, fluocinolone, fluocinonide, mometasone, clobetasol, halobetasol, betamethasone, hydrocortisone) can cause thinning and lightening of the skin if they are used for too long in the same area. Your physician has selected the right strength medicine for your problem and area affected on the body. Please use your medication only as directed by your physician to prevent side effects.

## 2020-03-23 NOTE — Progress Notes (Signed)
Follow-Up Visit   Subjective  Brooke Henson is a 55 y.o. female who presents for the following: Annual Exam (Patient here for TBSE. She has a rash on her left foot that came up 1-2 months ago. She uses Aveeno eczema that helps a little. No history of eczema or psoriasis. She has a history of dysplastic nevus of the right posterior shoulder. Patient has a family history of melanoma in mother.).  She has had a similar rash come on her other foot during the summer.   The following portions of the chart were reviewed this encounter and updated as appropriate:       Review of Systems:  No other skin or systemic complaints except as noted in HPI or Assessment and Plan.  Objective  Well appearing patient in no apparent distress; mood and affect are within normal limits.  A full examination was performed including scalp, head, eyes, ears, nose, lips, neck, chest, axillae, abdomen, back, buttocks, bilateral upper extremities, bilateral lower extremities, hands, feet, fingers, toes, fingernails, and toenails. All findings within normal limits unless otherwise noted below.  Objective  Right Mid Lower Back: 4.22mm two-toned brown papule  Left Shoulder: 7.38mm flesh brown papule  Right Hand Dorsum: 2.17mm medium dark brown papule  Right Upper Back: Medium dark brown macules (photos compared)  Left Inferior Buttock: 4.43mm medium brown papule with darker inferior  Images            Objective  Right Abdomen: 2.5 x 1.5cm speckled brown patch.  Objective  face: Mild erythema on cheeks and nose; inflammatory papules on left cheek.  Objective  Right Posterior Shoulder: Scar with no evidence of recurrence.   Objective  Left Medial Foot and posterior ankle: Well-demarcated bright pink scaly patches (foot) and plaque (post ankle).  Objective  R gluteal cleft: 7.18mm medium dark brown macule, slight irregular pigment     Objective  Frontal Scalp: Subcutaneous nodule at R  frontal scalp.    Assessment & Plan   Skin cancer screening performed today.  Actinic Damage - chronic, secondary to cumulative UV radiation exposure/sun exposure over time - diffuse scaly erythematous macules with underlying dyspigmentation - Recommend daily broad spectrum sunscreen SPF 30+ to sun-exposed areas, reapply every 2 hours as needed.  - Call for new or changing lesions.  Melanocytic Nevi - Tan-brown and/or pink-flesh-colored symmetric macules and papules - Benign appearing on exam today - Observation - Call clinic for new or changing moles - Recommend daily use of broad spectrum spf 30+ sunscreen to sun-exposed areas.   Lentigines - Scattered tan macules - Due to sun exposure - Benign-appering, observe - Recommend daily broad spectrum sunscreen SPF 30+ to sun-exposed areas, reapply every 2 hours as needed. - Call for any changes  Dermatofibroma - Firm pink/brown papulenodule with dimple sign, left medial calf and right medial thigh - Benign appearing - Call for any changes  Hemangiomas - Red papules - Discussed benign nature - Observe - Call for any changes  Nevus (5) Left Shoulder; Left Inferior Buttock; Right Upper Back; Right Mid Lower Back; Right Hand Dorsum  Benign-appearing.  Observation.  Call clinic for new or changing moles.  Recommend daily use of broad spectrum spf 30+ sunscreen to sun-exposed areas.   Photos compared (right upper back) from previous visit. Stable. New photos taken today.  Congenital non-neoplastic nevus Right Abdomen  Benign-appearing.  Observation.  Call clinic for new or changing moles.  Recommend daily use of broad spectrum spf 30+ sunscreen to sun-exposed  areas.    Rosacea face  Rosacea is a chronic progressive skin condition usually affecting the face of adults, causing redness and/or acne bumps. It is treatable but not curable. It sometimes affects the eyes (ocular rosacea) as well. It may respond to topical and/or  systemic medication and can flare with stress, sun exposure, alcohol, exercise and some foods.  Daily application of broad spectrum spf 30+ sunscreen to face is recommended to reduce flares.  Continue metronidazole 0.75% cream qhs 45g 5Rf.  History of dysplastic nevus Right Posterior Shoulder  Clear. Observe for recurrence. Call clinic for new or changing lesions.  Recommend regular skin exams, daily broad-spectrum spf 30+ sunscreen use, and photoprotection.     Dermatitis Left Medial Foot and posterior ankle  Vrs psoriasis  Start clobetasol cream Spot treat AA BID until rash clear dsp 30g 1Rf. Avoid face, groin, axilla.  Topical steroids (such as triamcinolone, fluocinolone, fluocinonide, mometasone, clobetasol, halobetasol, betamethasone, hydrocortisone) can cause thinning and lightening of the skin if they are used for too long in the same area. Your physician has selected the right strength medicine for your problem and area affected on the body. Please use your medication only as directed by your physician to prevent side effects.    Sample of AmLactin Foot repair. Apply to feet daily.  If not improving in 2 weeks, may send in antifungal cream.  clobetasol cream (TEMOVATE) 0.05 % - Left Medial Foot and posterior ankle  Neoplasm of uncertain behavior of skin R gluteal cleft  Epidermal / dermal shaving  Lesion diameter (cm):  1.1 Informed consent: discussed and consent obtained   Patient was prepped and draped in usual sterile fashion: Area prepped with alcohol. Anesthesia: the lesion was anesthetized in a standard fashion   Anesthetic:  1% lidocaine w/ epinephrine 1-100,000 buffered w/ 8.4% NaHCO3 Instrument used: flexible razor blade   Hemostasis achieved with: pressure, aluminum chloride and electrodesiccation   Outcome: patient tolerated procedure well   Post-procedure details: wound care instructions given   Post-procedure details comment:  Ointment and small bandage  applied  Specimen 1 - Surgical pathology Differential Diagnosis: Nevus r/o Dysplasia Check Margins: Yes 7.75mm medium dark brown macule, slight irregular pigment  Pilar cyst Frontal Scalp  Benign-appearing. 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 Discussed that a cyst is a benign growth that can grow over time and sometimes get irritated or inflamed. Recommend observation if it is not bothersome. Discussed option of surgical excision to remove it if it is growing, symptomatic, or other changes noted. Please call for new or changing lesions so they can be evaluated.    Return in about 1 year (around 03/23/2021) for TBSE.   IJamesetta Orleans, CMA, am acting as scribe for Brendolyn Patty, MD .  Documentation: I have reviewed the above documentation for accuracy and completeness, and I agree with the above.  Brendolyn Patty MD

## 2020-03-29 ENCOUNTER — Telehealth: Payer: Self-pay

## 2020-03-29 NOTE — Telephone Encounter (Signed)
Lft pt message to call for bx results/sh

## 2020-03-29 NOTE — Telephone Encounter (Signed)
-----   Message from Brendolyn Patty, MD sent at 03/26/2020 12:04 PM EST ----- Skin , right gluteal cleft DYSPLASTIC COMPOUND NEVUS WITH MODERATE ATYPIA, PERIPHERAL AND DEEP MARGINS INVOLVED  Moderately atypical mole, recommend observation

## 2020-03-30 ENCOUNTER — Telehealth: Payer: Self-pay

## 2020-03-30 NOTE — Telephone Encounter (Signed)
Patient advised of biopsy results.

## 2020-03-30 NOTE — Telephone Encounter (Signed)
-----   Message from Brendolyn Patty, MD sent at 03/26/2020 12:04 PM EST ----- Skin , right gluteal cleft DYSPLASTIC COMPOUND NEVUS WITH MODERATE ATYPIA, PERIPHERAL AND DEEP MARGINS INVOLVED  Moderately atypical mole, recommend observation

## 2020-05-29 ENCOUNTER — Encounter: Payer: Self-pay | Admitting: Nurse Practitioner

## 2020-06-04 ENCOUNTER — Ambulatory Visit (INDEPENDENT_AMBULATORY_CARE_PROVIDER_SITE_OTHER): Payer: Managed Care, Other (non HMO) | Admitting: Nurse Practitioner

## 2020-06-04 ENCOUNTER — Encounter: Payer: Self-pay | Admitting: Nurse Practitioner

## 2020-06-04 ENCOUNTER — Other Ambulatory Visit: Payer: Self-pay

## 2020-06-04 VITALS — BP 126/78 | HR 89 | Temp 98.3°F | Wt 256.8 lb

## 2020-06-04 DIAGNOSIS — I10 Essential (primary) hypertension: Secondary | ICD-10-CM

## 2020-06-04 DIAGNOSIS — E7800 Pure hypercholesterolemia, unspecified: Secondary | ICD-10-CM | POA: Diagnosis not present

## 2020-06-04 DIAGNOSIS — R7301 Impaired fasting glucose: Secondary | ICD-10-CM | POA: Diagnosis not present

## 2020-06-04 MED ORDER — HYDROCHLOROTHIAZIDE 25 MG PO TABS: ORAL_TABLET | ORAL | 4 refills | Status: AC

## 2020-06-04 NOTE — Assessment & Plan Note (Signed)
Chronic, ongoing with BP at goal in office and at home.  Recommend she monitor BP at least a few mornings a week at home and document.  DASH diet at home.  Continue current medication regimen and adjust as needed. Will follow-up in 6 months and if elevations consider addition of Amlodipine.  CMP and TSH today.

## 2020-06-04 NOTE — Assessment & Plan Note (Signed)
No current medications and ASCVD 2.1%.  Recheck lipid panel today, she is fasting today.  Continue diet and exercise focus.

## 2020-06-04 NOTE — Progress Notes (Signed)
BP 126/78   Pulse 89   Temp 98.3 F (36.8 C) (Oral)   Wt 256 lb 12.8 oz (116.5 kg)   SpO2 97%   BMI 35.82 kg/m    Subjective:    Patient ID: Brooke Henson, female    DOB: 09-17-1965, 55 y.o.   MRN: [id number]  HPI: Brooke Henson is a 55 y.o. female  Chief Complaint  Patient presents with  . Hypertension    Patient states she is doing well. Patient denies having any problems or concerns at today's visit.   Marland Kitchen Hyperlipidemia   HYPERTENSION Continues on HCTZ 25 MG daily. Last labs did note elevation in LDL at 130 on 06/04/19.  Continues to work with trainer twice a week.  Last last glucose -- 144 on labs.   Hypertension status:stable Satisfied with current treatment?yes Duration of hypertension:chronic BP monitoring frequency:daily BP range:120-130/80's average   BP medication side effects:no Medication compliance:good compliance Aspirin:no Recurrent headaches:no Visual changes:no Palpitations:no Dyspnea:no Chest pain:no Lower extremity edema:no Dizzy/lightheaded:no The 10-year ASCVD risk score Mikey Bussing DC Jr., et al., 2013) is: 2.1%   Values used to calculate the score:     Age: 32 years     Sex: Female     Is Non-Hispanic African American: No     Diabetic: No     Tobacco smoker: No     Systolic Blood Pressure: 409 mmHg     Is BP treated: Yes     HDL Cholesterol: 50 mg/dL     Total Cholesterol: 196 mg/dL  Relevant past medical, surgical, family and social history reviewed and updated as indicated. Interim medical history since our last visit reviewed. Allergies and medications reviewed and updated.  Review of Systems  Constitutional: Negative for activity change, appetite change, diaphoresis, fatigue and fever.  Respiratory: Negative for cough, chest tightness and shortness of breath.   Cardiovascular: Negative for chest pain, palpitations and leg swelling.  Gastrointestinal: Negative.   Neurological: Negative.   Psychiatric/Behavioral:  Negative.     Per HPI unless specifically indicated above     Objective:    BP 126/78   Pulse 89   Temp 98.3 F (36.8 C) (Oral)   Wt 256 lb 12.8 oz (116.5 kg)   SpO2 97%   BMI 35.82 kg/m   Wt Readings from Last 3 Encounters:  06/04/20 256 lb 12.8 oz (116.5 kg)  12/29/19 259 lb (117.5 kg)  12/05/19 255 lb 3.2 oz (115.8 kg)    Physical Exam Vitals and nursing note reviewed.  Constitutional:      General: She is awake. She is not in acute distress.    Appearance: She is well-developed and well-groomed. She is obese. She is not ill-appearing.  HENT:     Head: Normocephalic.     Right Ear: Hearing normal. No drainage.     Left Ear: Hearing normal. No drainage.  Eyes:     General: Lids are normal.        Right eye: No discharge.        Left eye: No discharge.     Conjunctiva/sclera: Conjunctivae normal.     Pupils: Pupils are equal, round, and reactive to light.  Neck:     Thyroid: No thyromegaly.     Vascular: No carotid bruit.  Cardiovascular:     Rate and Rhythm: Normal rate and regular rhythm.     Heart sounds: Normal heart sounds. No murmur heard. No gallop.   Pulmonary:     Effort: Pulmonary effort is  normal. No accessory muscle usage or respiratory distress.     Breath sounds: Normal breath sounds.  Abdominal:     General: Bowel sounds are normal.     Palpations: Abdomen is soft.  Musculoskeletal:     Cervical back: Normal range of motion and neck supple.     Right lower leg: No edema.     Left lower leg: No edema.  Lymphadenopathy:     Cervical: No cervical adenopathy.  Skin:    General: Skin is warm and dry.  Neurological:     Mental Status: She is alert and oriented to person, place, and time.  Psychiatric:        Attention and Perception: Attention normal.        Mood and Affect: Mood normal.        Speech: Speech normal.        Behavior: Behavior normal. Behavior is cooperative.        Thought Content: Thought content normal.    Results for  orders placed or performed in visit on 03/47/42  Basic metabolic panel  Result Value Ref Range   Glucose 144 (H) 65 - 99 mg/dL   BUN 16 6 - 24 mg/dL   Creatinine, Ser 0.76 0.57 - 1.00 mg/dL   GFR calc non Af Amer 91 >59 mL/min/1.73   GFR calc Af Amer 105 >59 mL/min/1.73   BUN/Creatinine Ratio 21 9 - 23   Sodium 141 134 - 144 mmol/L   Potassium 4.0 3.5 - 5.2 mmol/L   Chloride 102 96 - 106 mmol/L   CO2 25 20 - 29 mmol/L   Calcium 9.9 8.7 - 10.2 mg/dL  Hepatitis C antibody  Result Value Ref Range   Hep C Virus Ab <0.1 0.0 - 0.9 s/co ratio  TSH  Result Value Ref Range   TSH 0.965 0.450 - 4.500 uIU/mL      Assessment & Plan:   Problem List Items Addressed This Visit      Cardiovascular and Mediastinum   Hypertension    Chronic, ongoing with BP at goal in office and at home.  Recommend she monitor BP at least a few mornings a week at home and document.  DASH diet at home.  Continue current medication regimen and adjust as needed. Will follow-up in 6 months and if elevations consider addition of Amlodipine.  CMP and TSH today.      Relevant Medications   hydrochlorothiazide (HYDRODIURIL) 25 MG tablet   Other Relevant Orders   Comprehensive metabolic panel   TSH     Endocrine   IFG (impaired fasting glucose) - Primary    Check CMP and A1c today.  Focus on diet and initiate medication as needed.      Relevant Orders   Hemoglobin A1c     Other   Elevated LDL cholesterol level    No current medications and ASCVD 2.1%.  Recheck lipid panel today, she is fasting today.  Continue diet and exercise focus.      Relevant Orders   Lipid Panel w/o Chol/HDL Ratio       Follow up plan: Return in about 6 months (around 12/05/2020) for HTN/HLD, IFG.

## 2020-06-04 NOTE — Assessment & Plan Note (Addendum)
Check CMP and A1c today.  Focus on diet and initiate medication as needed.

## 2020-06-04 NOTE — Patient Instructions (Signed)
Healthy Eating Following a healthy eating pattern may help you to achieve and maintain a healthy body weight, reduce the risk of chronic disease, and live a long and productive life. It is important to follow a healthy eating pattern at an appropriate calorie level for your body. Your nutritional needs should be met primarily through food by choosing a variety of nutrient-rich foods. What are tips for following this plan? Reading food labels  Read labels and choose the following: ? Reduced or low sodium. ? Juices with 100% fruit juice. ? Foods with low saturated fats and high polyunsaturated and monounsaturated fats. ? Foods with whole grains, such as whole wheat, cracked wheat, brown rice, and wild rice. ? Whole grains that are fortified with folic acid. This is recommended for women who are pregnant or who want to become pregnant.  Read labels and avoid the following: ? Foods with a lot of added sugars. These include foods that contain brown sugar, corn sweetener, corn syrup, dextrose, fructose, glucose, high-fructose corn syrup, honey, invert sugar, lactose, malt syrup, maltose, molasses, raw sugar, sucrose, trehalose, or turbinado sugar.  Do not eat more than the following amounts of added sugar per day:  6 teaspoons (25 g) for women.  9 teaspoons (38 g) for men. ? Foods that contain processed or refined starches and grains. ? Refined grain products, such as white flour, degermed cornmeal, white bread, and white rice. Shopping  Choose nutrient-rich snacks, such as vegetables, whole fruits, and nuts. Avoid high-calorie and high-sugar snacks, such as potato chips, fruit snacks, and candy.  Use oil-based dressings and spreads on foods instead of solid fats such as butter, stick margarine, or cream cheese.  Limit pre-made sauces, mixes, and "instant" products such as flavored rice, instant noodles, and ready-made pasta.  Try more plant-protein sources, such as tofu, tempeh, black beans,  edamame, lentils, nuts, and seeds.  Explore eating plans such as the Mediterranean diet or vegetarian diet. Cooking  Use oil to saut or stir-fry foods instead of solid fats such as butter, stick margarine, or lard.  Try baking, boiling, grilling, or broiling instead of frying.  Remove the fatty part of meats before cooking.  Steam vegetables in water or broth. Meal planning  At meals, imagine dividing your plate into fourths: ? One-half of your plate is fruits and vegetables. ? One-fourth of your plate is whole grains. ? One-fourth of your plate is protein, especially lean meats, poultry, eggs, tofu, beans, or nuts.  Include low-fat dairy as part of your daily diet.   Lifestyle  Choose healthy options in all settings, including home, work, school, restaurants, or stores.  Prepare your food safely: ? Wash your hands after handling raw meats. ? Keep food preparation surfaces clean by regularly washing with hot, soapy water. ? Keep raw meats separate from ready-to-eat foods, such as fruits and vegetables. ? Cook seafood, meat, poultry, and eggs to the recommended internal temperature. ? Store foods at safe temperatures. In general:  Keep cold foods at 7F (4.4C) or below.  Keep hot foods at 17F (60C) or above.  Keep your freezer at [facility] (-17.8C) or below.  Foods are no longer safe to eat when they have been between the temperatures of 40-17F (4.4-60C) for more than 2 hours. What foods should I eat? Fruits Aim to eat 2 cup-equivalents of fresh, canned (in natural juice), or frozen fruits each day. Examples of 1 cup-equivalent of fruit include 1 small apple, 8 large strawberries, 1 cup canned fruit,  cup dried fruit, or 1 cup 100% juice. Vegetables Aim to eat 2-3 cup-equivalents of fresh and frozen vegetables each day, including different varieties and colors. Examples of 1 cup-equivalent of vegetables include 2 medium carrots, 2 cups raw, leafy greens, 1 cup chopped  vegetable (raw or cooked), or 1 medium baked potato. Grains Aim to eat 6 ounce-equivalents of whole grains each day. Examples of 1 ounce-equivalent of grains include 1 slice of bread, 1 cup ready-to-eat cereal, 3 cups popcorn, or  cup cooked rice, pasta, or cereal. Meats and other proteins Aim to eat 5-6 ounce-equivalents of protein each day. Examples of 1 ounce-equivalent of protein include 1 egg, 1/2 cup nuts or seeds, or 1 tablespoon (16 g) peanut butter. A cut of meat or fish that is the size of a deck of cards is about 3-4 ounce-equivalents.  Of the protein you eat each week, try to have at least 8 ounces come from seafood. This includes salmon, trout, herring, and anchovies. Dairy Aim to eat 3 cup-equivalents of fat-free or low-fat dairy each day. Examples of 1 cup-equivalent of dairy include 1 cup (240 mL) milk, 8 ounces (250 g) yogurt, 1 ounces (44 g) natural cheese, or 1 cup (240 mL) fortified soy milk. Fats and oils  Aim for about 5 teaspoons (21 g) per day. Choose monounsaturated fats, such as canola and olive oils, avocados, peanut butter, and most nuts, or polyunsaturated fats, such as sunflower, corn, and soybean oils, walnuts, pine nuts, sesame seeds, sunflower seeds, and flaxseed. Beverages  Aim for six 8-oz glasses of water per day. Limit coffee to three to five 8-oz cups per day.  Limit caffeinated beverages that have added calories, such as soda and energy drinks.  Limit alcohol intake to no more than 1 drink a day for nonpregnant women and 2 drinks a day for men. One drink equals 12 oz of beer (355 mL), 5 oz of wine (148 mL), or 1 oz of hard liquor (44 mL). Seasoning and other foods  Avoid adding excess amounts of salt to your foods. Try flavoring foods with herbs and spices instead of salt.  Avoid adding sugar to foods.  Try using oil-based dressings, sauces, and spreads instead of solid fats. This information is based on general [place] nutrition guidelines. For more  information, visit [employer]. Exact amounts may vary based on your nutrition needs. Summary  A healthy eating plan may help you to maintain a healthy weight, reduce the risk of chronic diseases, and stay active throughout your life.  Plan your meals. Make sure you eat the right portions of a variety of nutrient-rich foods.  Try baking, boiling, grilling, or broiling instead of frying.  Choose healthy options in all settings, including home, work, school, restaurants, or stores. This information is not intended to replace advice given to you by your health care provider. Make sure you discuss any questions you have with your health care provider. Document Revised: 04/23/2017 Document Reviewed: 04/23/2017 Elsevier Patient Education  [place].

## 2020-06-05 LAB — LIPID PANEL W/O CHOL/HDL RATIO
Cholesterol, Total: 211 mg/dL — ABNORMAL HIGH (ref 100–199)
HDL: 45 mg/dL (ref >39)
LDL Chol Calc (NIH): 146 mg/dL — ABNORMAL HIGH (ref 0–99)
Triglycerides: 110 mg/dL (ref 0–149)
VLDL Cholesterol Cal: 20 mg/dL (ref 5–40)

## 2020-06-05 LAB — COMPREHENSIVE METABOLIC PANEL
ALT: 19 IU/L (ref 0–32)
AST: 12 IU/L (ref 0–40)
Albumin/Globulin Ratio: 2 (ref 1.2–2.2)
Albumin: 4.5 g/dL (ref 3.8–4.9)
Alkaline Phosphatase: 74 IU/L (ref 44–121)
BUN/Creatinine Ratio: 24 — ABNORMAL HIGH (ref 9–23)
BUN: 18 mg/dL (ref 6–24)
Bilirubin Total: 0.6 mg/dL (ref 0.0–1.2)
CO2: 25 mmol/L (ref 20–29)
Calcium: 10 mg/dL (ref 8.7–10.2)
Chloride: 98 mmol/L (ref 96–106)
Creatinine, Ser: 0.74 mg/dL (ref 0.57–1.00)
Globulin, Total: 2.3 g/dL (ref 1.5–4.5)
Glucose: 105 mg/dL — ABNORMAL HIGH (ref 65–99)
Potassium: 4 mmol/L (ref 3.5–5.2)
Sodium: 142 mmol/L (ref 134–144)
Total Protein: 6.8 g/dL (ref 6.0–8.5)
eGFR: 97 mL/min/{1.73_m2} (ref >59)

## 2020-06-05 LAB — TSH: TSH: 1.23 u[IU]/mL (ref 0.450–4.500)

## 2020-06-05 LAB — HEMOGLOBIN A1C
Est. average glucose Bld gHb Est-mCnc: 126 mg/dL
Hgb A1c MFr Bld: 6 % — ABNORMAL HIGH (ref 4.8–5.6)

## 2020-06-05 NOTE — Progress Notes (Signed)
Contacted via MyChart The 10-year ASCVD risk score Mikey Bussing DC Jr., et al., 2013) is: 2.6%   Values used to calculate the score:     Age: 55 years     Sex: Female     Is Non-Hispanic African American: No     Diabetic: No     Tobacco smoker: No     Systolic Blood Pressure: 496 mmHg     Is BP treated: Yes     HDL Cholesterol: 45 mg/dL     Total Cholesterol: 211 mg/dL   Good evening Delphine, your labs have returned: - Cholesterol labs continue to show elevation in LDL, bad cholesterol, and total cholesterol.  I recommend continued heavy focus on diet and exercise.  We will continue to monitor as I discussed at visit. - Kidney function, creatinine and eGFR, and liver function, AST and ALT, are normal. - Thyroid normal - Glucose, sugar, mildly elevated.  The A1C is the diabetes testing we talked about, this looks at your blood sugars over the past 3 months and turns the average into a number.  Your number is 6.0%, meaning you are prediabetic.  Any number 5.7 to 6.4 is considered prediabetes and any number 6.5 or greater is considered diabetes.   I would recommend heavy focus on decreasing foods high in sugar and your intake of things like bread products, pasta, and rice.  The American Diabetes Association online has a large amount of information on diet changes to make.  We will recheck this number in 3 months to ensure you are not continuing to trend upwards and move into diabetes.  Any questions? Keep being amazing!!  Thank you for allowing me to participate in your care.  I appreciate you. Kindest regards, Kesha Hurrell

## 2020-06-10 LAB — HM DIABETES EYE EXAM

## 2020-06-24 ENCOUNTER — Other Ambulatory Visit: Payer: Self-pay | Admitting: Nurse Practitioner

## 2020-12-07 ENCOUNTER — Ambulatory Visit: Payer: Managed Care, Other (non HMO) | Admitting: Nurse Practitioner

## 2021-03-29 ENCOUNTER — Ambulatory Visit: Payer: Managed Care, Other (non HMO) | Admitting: Dermatology

## 2021-08-21 IMAGING — MG MM DIGITAL SCREENING BILAT W/ TOMO AND CAD
8 series · 8 of 24 positions shown · non-contrast
Comparison: Previous exam(s).

CLINICAL DATA: Screening.

EXAM:
DIGITAL SCREENING BILATERAL MAMMOGRAM WITH TOMOSYNTHESIS AND CAD
TECHNIQUE: Bilateral screening digital craniocaudal and mediolateral oblique
mammograms were obtained. Bilateral screening digital breast
tomosynthesis was performed. The images were evaluated with
computer-aided detection.

[R CC synth-2D]
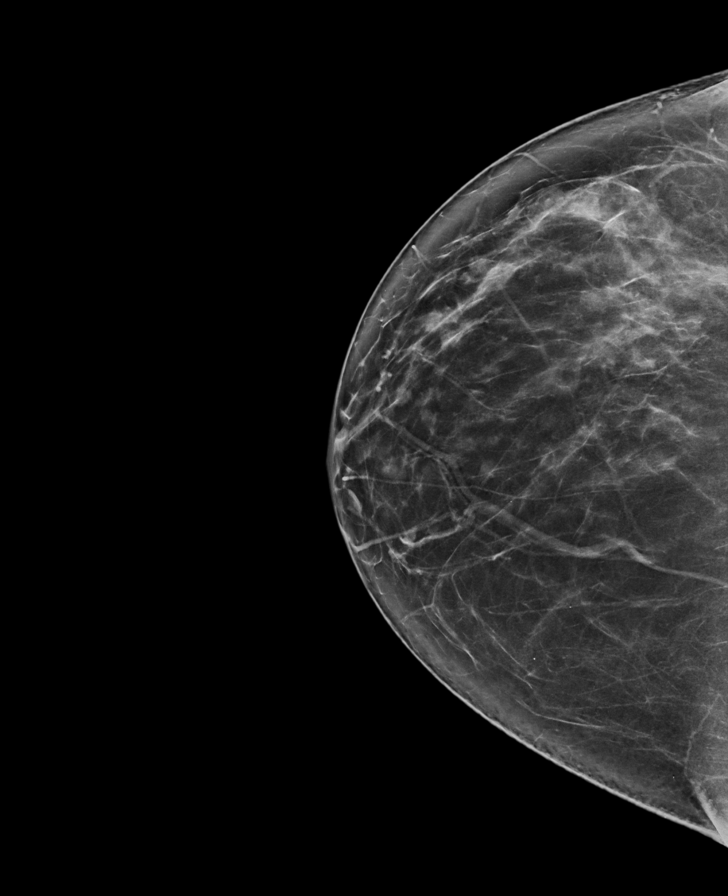

[L CC synth-2D]
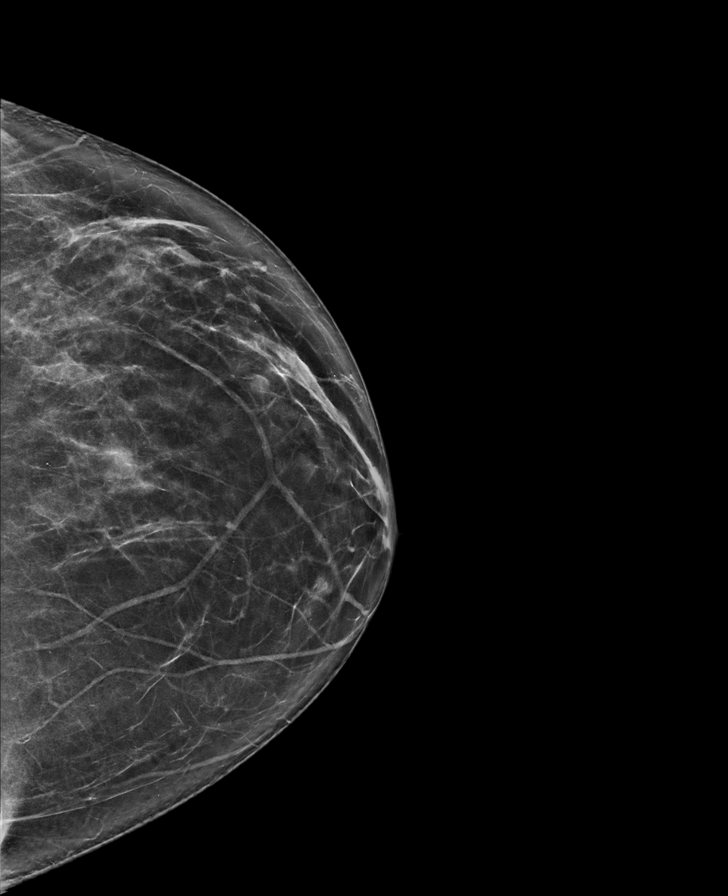

[L MLO synth-2D]
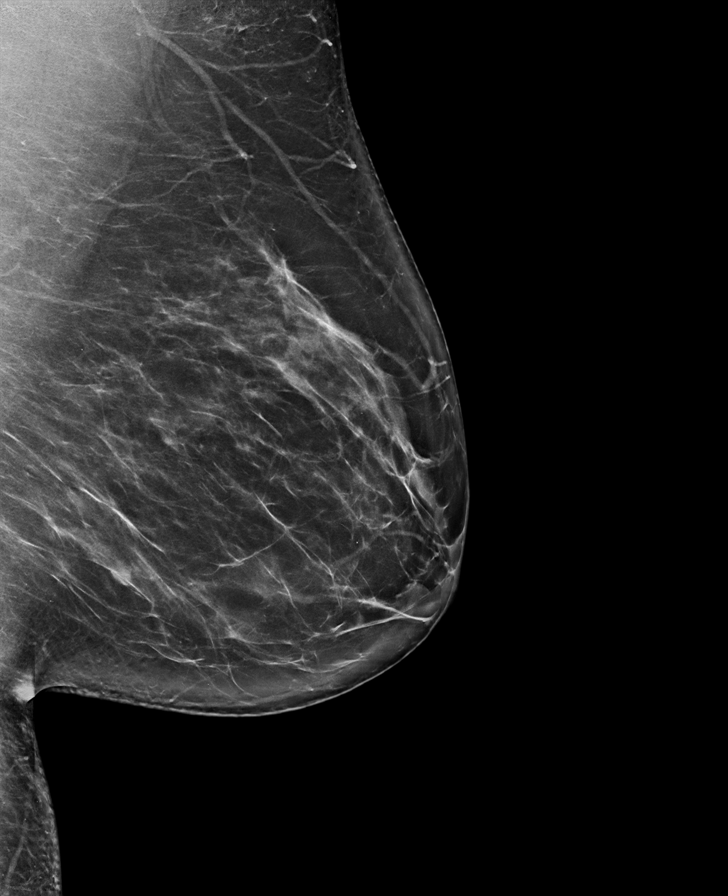

[R MLO synth-2D]
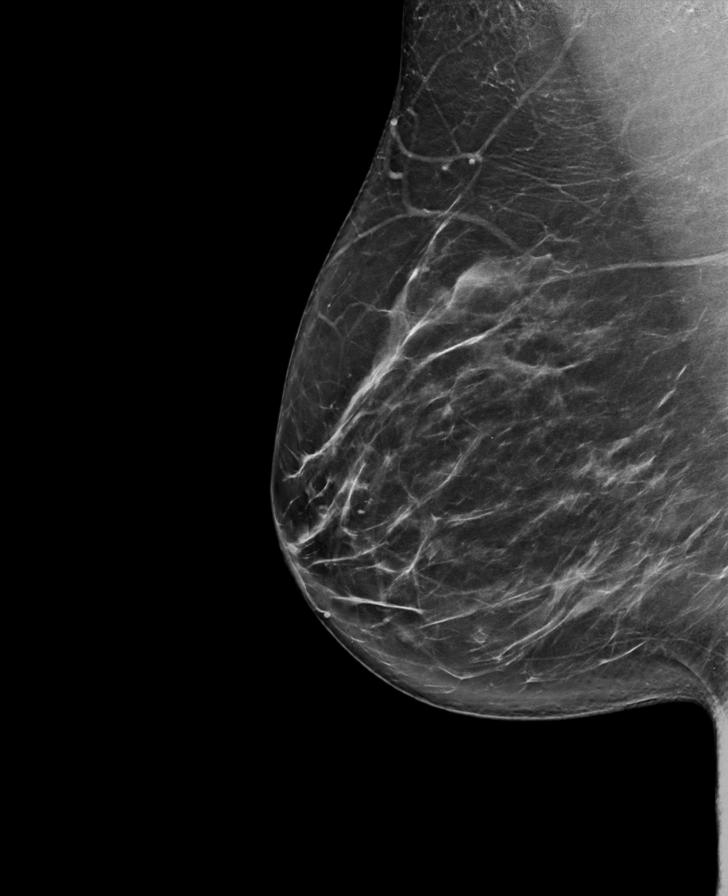

[R MLO tomo · tomo slice 45/89.0]
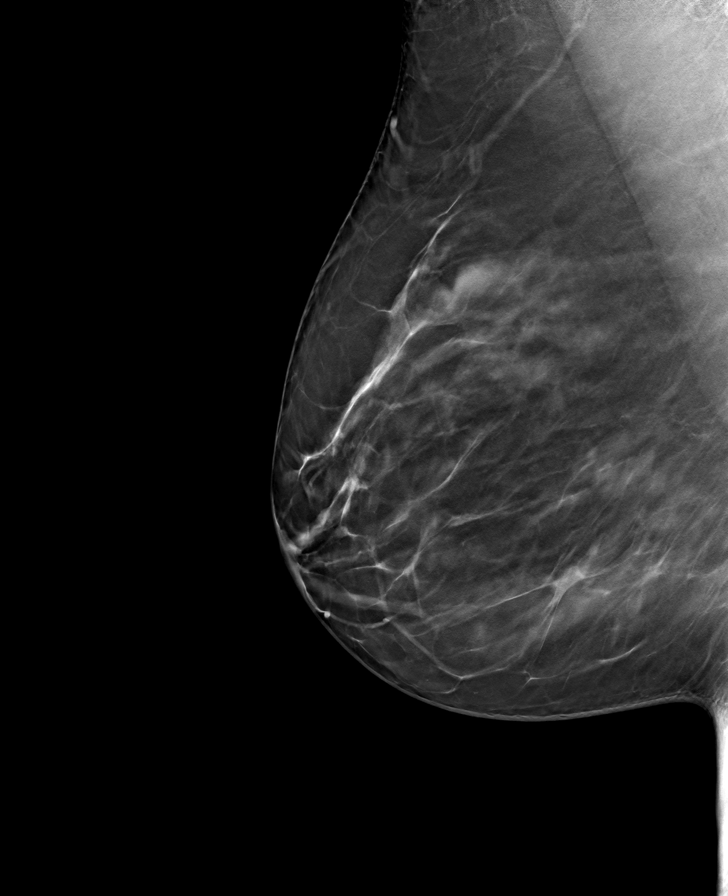

[L CC tomo · tomo slice 41/80.0]
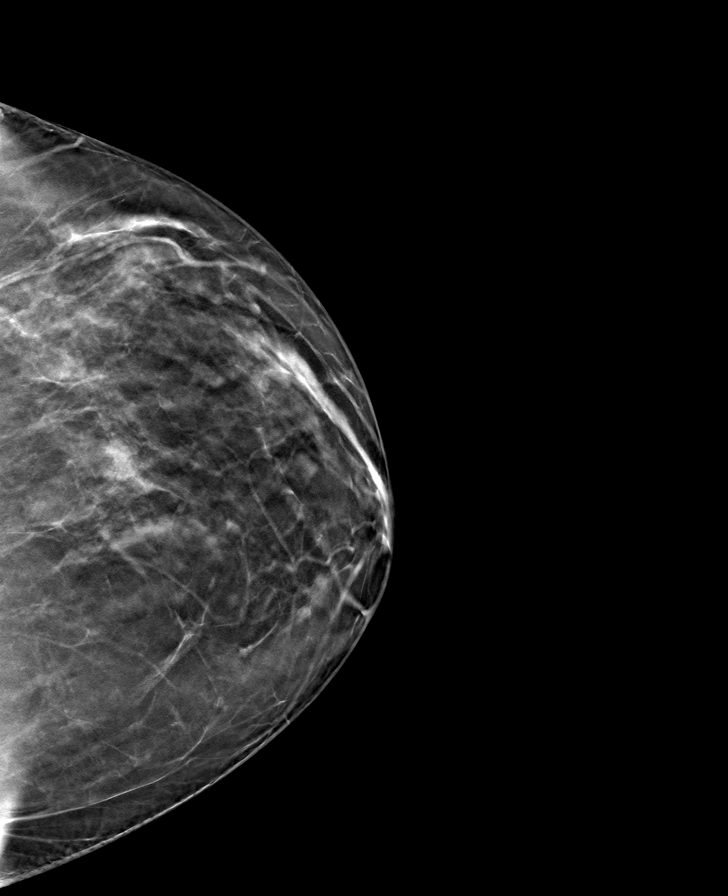

[R CC tomo · tomo slice 42/83.0]
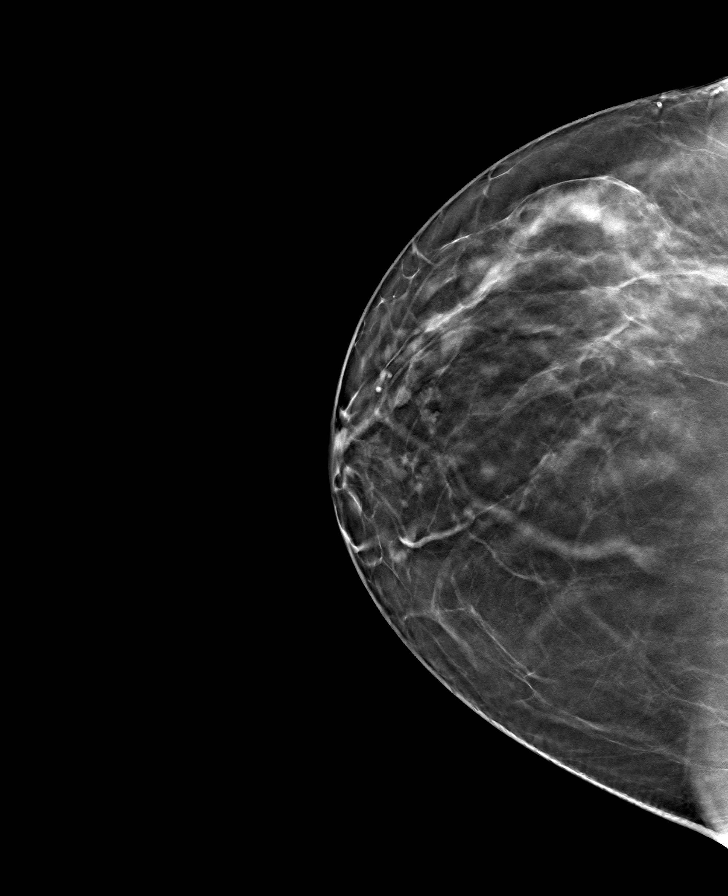

[L MLO tomo · tomo slice 47/93.0]
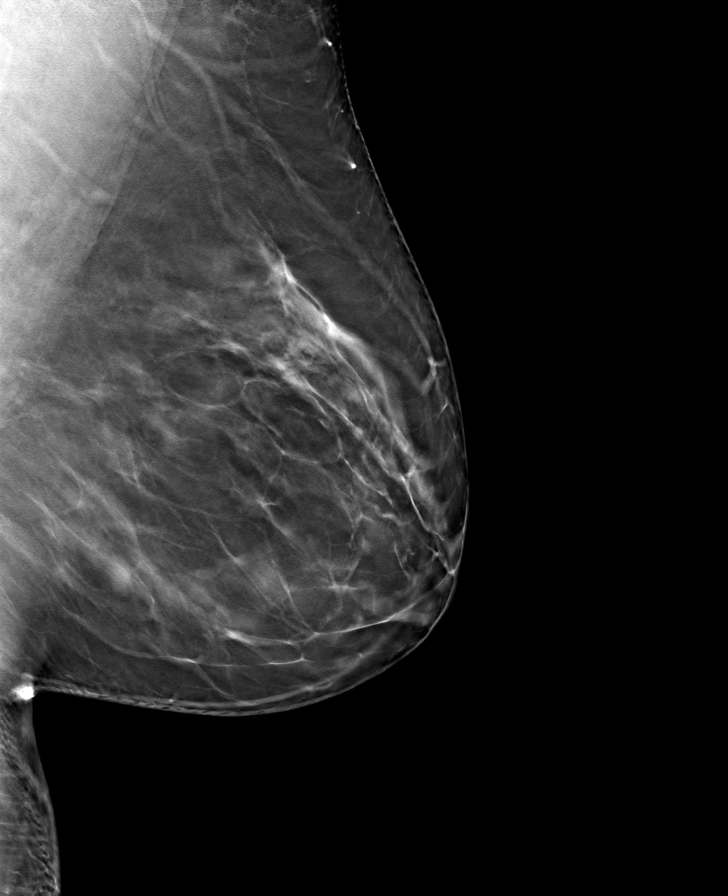

[8 of 24 positions shown; findings below may reference images not displayed]

ACR Breast Density Category b: There are scattered areas of
fibroglandular density.
FINDINGS: There are no findings suspicious for malignancy.
IMPRESSION: No mammographic evidence of malignancy. A result letter of this
screening mammogram will be mailed directly to the patient.

RECOMMENDATION:
Screening mammogram in one year. (Code:51-O-LD2)

BI-RADS CATEGORY  1: Negative.
